# Patient Record
Sex: Male | Born: 1995 | Race: Black or African American | Hispanic: No | Marital: Single | Smoking: Never smoker
Health system: Southern US, Community
[De-identification: ages and names within clinical notes are randomized; demographics above are authoritative.]

## PROBLEM LIST (undated history)

## (undated) ENCOUNTER — Emergency Department (HOSPITAL_COMMUNITY): Admission: EM | Payer: Medicaid Other | Source: Home / Self Care

## (undated) DIAGNOSIS — T4145XA Adverse effect of unspecified anesthetic, initial encounter: Secondary | ICD-10-CM

## (undated) DIAGNOSIS — T8859XA Other complications of anesthesia, initial encounter: Secondary | ICD-10-CM

## (undated) DIAGNOSIS — G8929 Other chronic pain: Secondary | ICD-10-CM

## (undated) DIAGNOSIS — G4700 Insomnia, unspecified: Secondary | ICD-10-CM

## (undated) DIAGNOSIS — M542 Cervicalgia: Secondary | ICD-10-CM

## (undated) DIAGNOSIS — S14104A Unspecified injury at C4 level of cervical spinal cord, initial encounter: Secondary | ICD-10-CM

## (undated) DIAGNOSIS — W3400XA Accidental discharge from unspecified firearms or gun, initial encounter: Secondary | ICD-10-CM

## (undated) HISTORY — PX: COLON SURGERY: SHX602

---
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
## 2015-12-07 ENCOUNTER — Encounter (HOSPITAL_COMMUNITY): Admission: EM | Disposition: A | Discharge status: Home Health | Payer: Self-pay | Source: Home / Self Care

## 2015-12-07 ENCOUNTER — Inpatient Hospital Stay (HOSPITAL_COMMUNITY)
Admission: EM | Admit: 2015-12-07 | Discharge: 2015-12-24 | DRG: 329 | Disposition: A | Discharge status: Home Health | Payer: Medicaid Other | Attending: General Surgery | Admitting: General Surgery

## 2015-12-07 ENCOUNTER — Emergency Department (HOSPITAL_COMMUNITY): Payer: Medicaid Other

## 2015-12-07 ENCOUNTER — Emergency Department (HOSPITAL_COMMUNITY): Payer: Medicaid Other | Admitting: Anesthesiology

## 2015-12-07 ENCOUNTER — Encounter (HOSPITAL_COMMUNITY): Payer: Self-pay

## 2015-12-07 DIAGNOSIS — T8130XA Disruption of wound, unspecified, initial encounter: Secondary | ICD-10-CM | POA: Diagnosis not present

## 2015-12-07 DIAGNOSIS — D72829 Elevated white blood cell count, unspecified: Secondary | ICD-10-CM

## 2015-12-07 DIAGNOSIS — S36498A Other injury of other part of small intestine, initial encounter: Secondary | ICD-10-CM | POA: Diagnosis present

## 2015-12-07 DIAGNOSIS — T148 Other injury of unspecified body region: Secondary | ICD-10-CM | POA: Diagnosis present

## 2015-12-07 DIAGNOSIS — K651 Peritoneal abscess: Secondary | ICD-10-CM | POA: Diagnosis not present

## 2015-12-07 DIAGNOSIS — K659 Peritonitis, unspecified: Secondary | ICD-10-CM | POA: Diagnosis present

## 2015-12-07 DIAGNOSIS — R579 Shock, unspecified: Secondary | ICD-10-CM | POA: Diagnosis present

## 2015-12-07 DIAGNOSIS — N179 Acute kidney failure, unspecified: Secondary | ICD-10-CM | POA: Diagnosis present

## 2015-12-07 DIAGNOSIS — R630 Anorexia: Secondary | ICD-10-CM | POA: Diagnosis not present

## 2015-12-07 DIAGNOSIS — Y249XXA Unspecified firearm discharge, undetermined intent, initial encounter: Secondary | ICD-10-CM

## 2015-12-07 DIAGNOSIS — D62 Acute posthemorrhagic anemia: Secondary | ICD-10-CM | POA: Diagnosis not present

## 2015-12-07 DIAGNOSIS — Z590 Homelessness: Secondary | ICD-10-CM

## 2015-12-07 DIAGNOSIS — S36509A Unspecified injury of unspecified part of colon, initial encounter: Secondary | ICD-10-CM | POA: Diagnosis present

## 2015-12-07 DIAGNOSIS — W3400XA Accidental discharge from unspecified firearms or gun, initial encounter: Secondary | ICD-10-CM

## 2015-12-07 DIAGNOSIS — S31604A Unspecified open wound of abdominal wall, left lower quadrant with penetration into peritoneal cavity, initial encounter: Secondary | ICD-10-CM | POA: Diagnosis present

## 2015-12-07 DIAGNOSIS — S36409A Unspecified injury of unspecified part of small intestine, initial encounter: Secondary | ICD-10-CM | POA: Diagnosis present

## 2015-12-07 DIAGNOSIS — S31139A Puncture wound of abdominal wall without foreign body, unspecified quadrant without penetration into peritoneal cavity, initial encounter: Secondary | ICD-10-CM

## 2015-12-07 DIAGNOSIS — S36593A Other injury of sigmoid colon, initial encounter: Secondary | ICD-10-CM | POA: Diagnosis present

## 2015-12-07 DIAGNOSIS — K567 Ileus, unspecified: Secondary | ICD-10-CM

## 2015-12-07 DIAGNOSIS — S36503A Unspecified injury of sigmoid colon, initial encounter: Secondary | ICD-10-CM | POA: Diagnosis present

## 2015-12-07 DIAGNOSIS — T8149XA Infection following a procedure, other surgical site, initial encounter: Secondary | ICD-10-CM | POA: Diagnosis not present

## 2015-12-07 DIAGNOSIS — R188 Other ascites: Secondary | ICD-10-CM

## 2015-12-07 HISTORY — PX: COLOSTOMY REVISION: SHX5232

## 2015-12-07 HISTORY — PX: COLOSTOMY: SHX63

## 2015-12-07 HISTORY — PX: LAPAROTOMY: SHX154

## 2015-12-07 HISTORY — DX: Accidental discharge from unspecified firearms or gun, initial encounter: W34.00XA

## 2015-12-07 HISTORY — DX: Unspecified firearm discharge, undetermined intent, initial encounter: Y24.9XXA

## 2015-12-07 HISTORY — PX: BOWEL RESECTION: SHX1257

## 2015-12-07 LAB — TYPE AND SCREEN
ABO/RH(D): O POS
Antibody Screen: NEGATIVE
UNIT DIVISION: 0
UNIT DIVISION: 0

## 2015-12-07 LAB — COMPREHENSIVE METABOLIC PANEL
ALT: 27 U/L (ref 17–63)
AST: 24 U/L (ref 15–41)
Albumin: 3.6 g/dL (ref 3.5–5.0)
Alkaline Phosphatase: 67 U/L (ref 38–126)
Anion gap: 16 — ABNORMAL HIGH (ref 5–15)
BILIRUBIN TOTAL: 0.9 mg/dL (ref 0.3–1.2)
BUN: 10 mg/dL (ref 6–20)
CHLORIDE: 104 mmol/L (ref 101–111)
CO2: 18 mmol/L — ABNORMAL LOW (ref 22–32)
CREATININE: 1.33 mg/dL — AB (ref 0.61–1.24)
Calcium: 8.3 mg/dL — ABNORMAL LOW (ref 8.9–10.3)
Glucose, Bld: 180 mg/dL — ABNORMAL HIGH (ref 65–99)
Potassium: 3.1 mmol/L — ABNORMAL LOW (ref 3.5–5.1)
Sodium: 138 mmol/L (ref 135–145)
Total Protein: 6.6 g/dL (ref 6.5–8.1)

## 2015-12-07 LAB — CBC
HEMATOCRIT: 41.1 % (ref 39.0–52.0)
Hemoglobin: 13.5 g/dL (ref 13.0–17.0)
MCH: 29.5 pg (ref 26.0–34.0)
MCHC: 32.8 g/dL (ref 30.0–36.0)
MCV: 89.7 fL (ref 78.0–100.0)
Platelets: 269 10*3/uL (ref 150–400)
RBC: 4.58 MIL/uL (ref 4.22–5.81)
RDW: 12.7 % (ref 11.5–15.5)
WBC: 11 10*3/uL — ABNORMAL HIGH (ref 4.0–10.5)

## 2015-12-07 LAB — ABO/RH: ABO/RH(D): O POS

## 2015-12-07 LAB — PREPARE FRESH FROZEN PLASMA
UNIT DIVISION: 0
UNIT DIVISION: 0

## 2015-12-07 LAB — MRSA PCR SCREENING: MRSA by PCR: NEGATIVE

## 2015-12-07 LAB — PROTIME-INR
INR: 1.03 (ref 0.00–1.49)
Prothrombin Time: 13.7 seconds (ref 11.6–15.2)

## 2015-12-07 LAB — ETHANOL: Alcohol, Ethyl (B): 146 mg/dL — ABNORMAL HIGH (ref <5)

## 2015-12-07 LAB — CDS SEROLOGY

## 2015-12-07 SURGERY — LAPAROTOMY, EXPLORATORY
Anesthesia: General | Site: Abdomen

## 2015-12-07 MED ORDER — METOPROLOL TARTRATE 1 MG/ML IV SOLN: 5.0000 mg | Freq: Four times a day (QID) | INTRAVENOUS | Status: DC | PRN

## 2015-12-07 MED ORDER — HYDROMORPHONE 1 MG/ML IV SOLN
INTRAVENOUS | Status: DC
  Administered 2015-12-07: 0.9 mg via INTRAVENOUS
  Administered 2015-12-07: 11:00:00 via INTRAVENOUS
  Administered 2015-12-08 (×3): 1.2 mg via INTRAVENOUS
  Administered 2015-12-08: 0.9 mg via INTRAVENOUS
  Administered 2015-12-08: 1.8 mg via INTRAVENOUS
  Administered 2015-12-08: 0.6 mg via INTRAVENOUS
  Administered 2015-12-09 (×2): 3.3 mg via INTRAVENOUS
  Administered 2015-12-09: 2.7 mg via INTRAVENOUS
  Administered 2015-12-09: 1.2 mg via INTRAVENOUS
  Administered 2015-12-09: 1.8 mg via INTRAVENOUS
  Administered 2015-12-10: 0.9 mg via INTRAVENOUS
  Administered 2015-12-10: 1.8 mg via INTRAVENOUS
  Administered 2015-12-10: 1.5 mg via INTRAVENOUS
  Administered 2015-12-10: 0.6 mg via INTRAVENOUS
  Administered 2015-12-10 (×2): 1.2 mg via INTRAVENOUS
  Administered 2015-12-11: 1.9 mg via INTRAVENOUS
  Administered 2015-12-11: 3.6 mg via INTRAVENOUS
  Administered 2015-12-11: 4.2 mg via INTRAVENOUS
  Filled 2015-12-07 (×2): qty 25

## 2015-12-07 MED ORDER — PHENOL 1.4 % MT LIQD
2.0000 | OROMUCOSAL | Status: DC | PRN
  Administered 2015-12-09: 2 via OROMUCOSAL
  Filled 2015-12-07: qty 177

## 2015-12-07 MED ORDER — SUCCINYLCHOLINE CHLORIDE 20 MG/ML IJ SOLN
INTRAMUSCULAR | Status: DC | PRN
  Administered 2015-12-07: 120 mg via INTRAVENOUS

## 2015-12-07 MED ORDER — ALUM & MAG HYDROXIDE-SIMETH 200-200-20 MG/5ML PO SUSP: 30.0000 mL | Freq: Four times a day (QID) | ORAL | Status: DC | PRN

## 2015-12-07 MED ORDER — PROPOFOL 10 MG/ML IV BOLUS
INTRAVENOUS | Status: DC | PRN
  Administered 2015-12-07: 200 mg via INTRAVENOUS

## 2015-12-07 MED ORDER — ACETAMINOPHEN 325 MG PO TABS
325.0000 mg | ORAL_TABLET | Freq: Four times a day (QID) | ORAL | Status: DC | PRN
  Administered 2015-12-17 – 2015-12-18 (×3): 650 mg via ORAL
  Filled 2015-12-07 (×3): qty 2

## 2015-12-07 MED ORDER — MAGIC MOUTHWASH: 15.0000 mL | Freq: Four times a day (QID) | ORAL | Status: DC | PRN

## 2015-12-07 MED ORDER — ONDANSETRON HCL 4 MG/2ML IJ SOLN
INTRAMUSCULAR | Status: AC
  Filled 2015-12-07: qty 2

## 2015-12-07 MED ORDER — ALBUMIN HUMAN 5 % IV SOLN
INTRAVENOUS | Status: DC | PRN
  Administered 2015-12-07: 05:00:00 via INTRAVENOUS

## 2015-12-07 MED ORDER — FENTANYL CITRATE (PF) 250 MCG/5ML IJ SOLN
INTRAMUSCULAR | Status: DC | PRN
  Administered 2015-12-07: 100 ug via INTRAVENOUS

## 2015-12-07 MED ORDER — STERILE WATER FOR INJECTION IJ SOLN
INTRAMUSCULAR | Status: AC
  Filled 2015-12-07: qty 10

## 2015-12-07 MED ORDER — SODIUM CHLORIDE 0.9 % IV SOLN
8.0000 mg | Freq: Four times a day (QID) | INTRAVENOUS | Status: DC | PRN
  Filled 2015-12-07: qty 4

## 2015-12-07 MED ORDER — METHOCARBAMOL 1000 MG/10ML IJ SOLN
1000.0000 mg | Freq: Four times a day (QID) | INTRAMUSCULAR | Status: DC | PRN
  Administered 2015-12-12 (×2): 1000 mg via INTRAVENOUS
  Filled 2015-12-07 (×8): qty 10

## 2015-12-07 MED ORDER — ENOXAPARIN SODIUM 30 MG/0.3ML ~~LOC~~ SOLN
30.0000 mg | Freq: Two times a day (BID) | SUBCUTANEOUS | Status: AC
  Administered 2015-12-07 – 2015-12-15 (×16): 30 mg via SUBCUTANEOUS
  Filled 2015-12-07 (×19): qty 0.3

## 2015-12-07 MED ORDER — ETOMIDATE 2 MG/ML IV SOLN
INTRAVENOUS | Status: AC
  Filled 2015-12-07: qty 10

## 2015-12-07 MED ORDER — NEOSTIGMINE METHYLSULFATE 10 MG/10ML IV SOLN
INTRAVENOUS | Status: DC | PRN
  Administered 2015-12-07: 4 mg via INTRAVENOUS

## 2015-12-07 MED ORDER — ONDANSETRON HCL 4 MG/2ML IJ SOLN: 4.0000 mg | Freq: Four times a day (QID) | INTRAMUSCULAR | Status: DC | PRN

## 2015-12-07 MED ORDER — MENTHOL 3 MG MT LOZG
1.0000 | LOZENGE | OROMUCOSAL | Status: DC | PRN
  Filled 2015-12-07: qty 9

## 2015-12-07 MED ORDER — FENTANYL CITRATE (PF) 250 MCG/5ML IJ SOLN
INTRAMUSCULAR | Status: AC
  Filled 2015-12-07: qty 5

## 2015-12-07 MED ORDER — SODIUM CHLORIDE 0.9% FLUSH: 9.0000 mL | INTRAVENOUS | Status: DC | PRN

## 2015-12-07 MED ORDER — LIP MEDEX EX OINT
1.0000 "application " | TOPICAL_OINTMENT | Freq: Two times a day (BID) | CUTANEOUS | Status: DC
  Filled 2015-12-07: qty 7

## 2015-12-07 MED ORDER — PROMETHAZINE HCL 25 MG/ML IJ SOLN
6.2500 mg | INTRAMUSCULAR | Status: DC | PRN
  Administered 2015-12-10 – 2015-12-11 (×2): 12.5 mg via INTRAVENOUS
  Filled 2015-12-07 (×2): qty 1

## 2015-12-07 MED ORDER — LACTATED RINGERS IV SOLN
INTRAVENOUS | Status: DC | PRN
  Administered 2015-12-07 (×2): via INTRAVENOUS

## 2015-12-07 MED ORDER — SODIUM CHLORIDE 0.9 % IV BOLUS (SEPSIS)
2000.0000 mL | Freq: Once | INTRAVENOUS | Status: AC
  Administered 2015-12-07: 2000 mL via INTRAVENOUS

## 2015-12-07 MED ORDER — ONDANSETRON HCL 4 MG/2ML IJ SOLN
INTRAMUSCULAR | Status: DC | PRN
  Administered 2015-12-07: 4 mg via INTRAVENOUS

## 2015-12-07 MED ORDER — DIPHENHYDRAMINE HCL 50 MG/ML IJ SOLN: 12.5000 mg | Freq: Four times a day (QID) | INTRAMUSCULAR | Status: DC | PRN

## 2015-12-07 MED ORDER — LACTATED RINGERS IV SOLN
INTRAVENOUS | Status: DC
Start: 2015-12-07 — End: 2015-12-13
  Administered 2015-12-07 – 2015-12-11 (×6): via INTRAVENOUS

## 2015-12-07 MED ORDER — GLYCOPYRROLATE 0.2 MG/ML IJ SOLN
INTRAMUSCULAR | Status: AC
  Filled 2015-12-07: qty 3

## 2015-12-07 MED ORDER — DEXTROSE 5 % IV SOLN
2.0000 g | Freq: Once | INTRAVENOUS | Status: AC
  Administered 2015-12-07: 2 g via INTRAVENOUS
  Filled 2015-12-07: qty 2

## 2015-12-07 MED ORDER — DEXTROSE 5 % IV SOLN
2.0000 g | Freq: Two times a day (BID) | INTRAVENOUS | Status: AC
  Administered 2015-12-07: 2 g via INTRAVENOUS
  Filled 2015-12-07: qty 2

## 2015-12-07 MED ORDER — KETOROLAC TROMETHAMINE 15 MG/ML IJ SOLN
15.0000 mg | Freq: Four times a day (QID) | INTRAMUSCULAR | Status: DC | PRN
  Administered 2015-12-07 – 2015-12-09 (×2): 15 mg via INTRAVENOUS
  Filled 2015-12-07 (×2): qty 1

## 2015-12-07 MED ORDER — INFLUENZA VAC SPLIT QUAD 0.5 ML IM SUSY
0.5000 mL | PREFILLED_SYRINGE | INTRAMUSCULAR | Status: DC
  Filled 2015-12-07: qty 1
  Filled 2015-12-07 (×2): qty 0.5

## 2015-12-07 MED ORDER — PROMETHAZINE HCL 25 MG/ML IJ SOLN
INTRAMUSCULAR | Status: AC
  Filled 2015-12-07: qty 1

## 2015-12-07 MED ORDER — 0.9 % SODIUM CHLORIDE (POUR BTL) OPTIME
TOPICAL | Status: DC | PRN
  Administered 2015-12-07 (×6): 1000 mL

## 2015-12-07 MED ORDER — DIPHENHYDRAMINE HCL 50 MG/ML IJ SOLN
INTRAMUSCULAR | Status: DC | PRN
  Administered 2015-12-07: 25 mg via INTRAVENOUS

## 2015-12-07 MED ORDER — ROCURONIUM BROMIDE 100 MG/10ML IV SOLN
INTRAVENOUS | Status: DC | PRN
  Administered 2015-12-07: 50 mg via INTRAVENOUS

## 2015-12-07 MED ORDER — LIDOCAINE HCL (CARDIAC) 20 MG/ML IV SOLN
INTRAVENOUS | Status: AC
Start: 2015-12-07 — End: 2015-12-07
  Filled 2015-12-07: qty 10

## 2015-12-07 MED ORDER — LIDOCAINE HCL (CARDIAC) 20 MG/ML IV SOLN
INTRAVENOUS | Status: DC | PRN
  Administered 2015-12-07: 80 mg via INTRATRACHEAL

## 2015-12-07 MED ORDER — LACTATED RINGERS IV BOLUS (SEPSIS): 1000.0000 mL | Freq: Three times a day (TID) | INTRAVENOUS | Status: DC | PRN

## 2015-12-07 MED ORDER — EPHEDRINE SULFATE 50 MG/ML IJ SOLN
INTRAMUSCULAR | Status: AC
  Filled 2015-12-07: qty 1

## 2015-12-07 MED ORDER — HYDROMORPHONE HCL 1 MG/ML IJ SOLN: 0.5000 mg | INTRAMUSCULAR | Status: DC | PRN

## 2015-12-07 MED ORDER — GLYCOPYRROLATE 0.2 MG/ML IJ SOLN
INTRAMUSCULAR | Status: DC | PRN
  Administered 2015-12-07: 0.6 mg via INTRAVENOUS

## 2015-12-07 MED ORDER — SUCCINYLCHOLINE CHLORIDE 20 MG/ML IJ SOLN
INTRAMUSCULAR | Status: AC
  Filled 2015-12-07: qty 1

## 2015-12-07 MED ORDER — NALOXONE HCL 0.4 MG/ML IJ SOLN: 0.4000 mg | INTRAMUSCULAR | Status: DC | PRN

## 2015-12-07 MED ORDER — DIPHENHYDRAMINE HCL 12.5 MG/5ML PO ELIX: 12.5000 mg | ORAL_SOLUTION | Freq: Four times a day (QID) | ORAL | Status: DC | PRN

## 2015-12-07 MED ORDER — PHENYLEPHRINE HCL 10 MG/ML IJ SOLN
INTRAMUSCULAR | Status: AC
  Filled 2015-12-07: qty 1

## 2015-12-07 MED ORDER — PHENYLEPHRINE 40 MCG/ML (10ML) SYRINGE FOR IV PUSH (FOR BLOOD PRESSURE SUPPORT)
PREFILLED_SYRINGE | INTRAVENOUS | Status: AC
  Filled 2015-12-07: qty 10

## 2015-12-07 MED ORDER — NEOSTIGMINE METHYLSULFATE 10 MG/10ML IV SOLN
INTRAVENOUS | Status: AC
  Filled 2015-12-07: qty 2

## 2015-12-07 MED ORDER — LORAZEPAM 2 MG/ML IJ SOLN
0.5000 mg | Freq: Three times a day (TID) | INTRAMUSCULAR | Status: DC | PRN
  Administered 2015-12-11 – 2015-12-14 (×3): 1 mg via INTRAVENOUS
  Filled 2015-12-07 (×3): qty 1

## 2015-12-07 MED ORDER — ONDANSETRON HCL 4 MG/2ML IJ SOLN
4.0000 mg | Freq: Four times a day (QID) | INTRAMUSCULAR | Status: DC | PRN
  Administered 2015-12-10 – 2015-12-24 (×3): 4 mg via INTRAVENOUS
  Filled 2015-12-07 (×3): qty 2

## 2015-12-07 MED ORDER — PROMETHAZINE HCL 25 MG/ML IJ SOLN
6.2500 mg | INTRAMUSCULAR | Status: DC | PRN
  Administered 2015-12-07: 6.25 mg via INTRAVENOUS

## 2015-12-07 MED ORDER — HYDROMORPHONE HCL 1 MG/ML IJ SOLN
INTRAMUSCULAR | Status: AC
  Filled 2015-12-07: qty 1

## 2015-12-07 MED ORDER — MIDAZOLAM HCL 2 MG/2ML IJ SOLN
INTRAMUSCULAR | Status: AC
  Filled 2015-12-07: qty 2

## 2015-12-07 MED ORDER — ROCURONIUM BROMIDE 50 MG/5ML IV SOLN
INTRAVENOUS | Status: AC
  Filled 2015-12-07: qty 2

## 2015-12-07 MED ORDER — HYDROMORPHONE HCL 1 MG/ML IJ SOLN
0.2500 mg | INTRAMUSCULAR | Status: DC | PRN
  Administered 2015-12-07: 0.5 mg via INTRAVENOUS

## 2015-12-07 MED ORDER — ARTIFICIAL TEARS OP OINT
TOPICAL_OINTMENT | OPHTHALMIC | Status: AC
  Filled 2015-12-07: qty 3.5

## 2015-12-07 MED ORDER — BLISTEX MEDICATED EX OINT
TOPICAL_OINTMENT | Freq: Two times a day (BID) | CUTANEOUS | Status: DC
  Administered 2015-12-07 – 2015-12-16 (×18): via TOPICAL
  Administered 2015-12-16: 1 via TOPICAL
  Administered 2015-12-18 – 2015-12-21 (×6): via TOPICAL
  Administered 2015-12-22: 1 via TOPICAL
  Administered 2015-12-23 – 2015-12-24 (×3): via TOPICAL
  Filled 2015-12-07: qty 10

## 2015-12-07 SURGICAL SUPPLY — 51 items
BLADE SURG ROTATE 9660 (MISCELLANEOUS) IMPLANT
CANISTER SUCTION 2500CC (MISCELLANEOUS) ×4 IMPLANT
CHLORAPREP W/TINT 26ML (MISCELLANEOUS) ×4 IMPLANT
COVER SURGICAL LIGHT HANDLE (MISCELLANEOUS) ×4 IMPLANT
DRAPE LAPAROSCOPIC ABDOMINAL (DRAPES) ×4 IMPLANT
DRAPE WARM FLUID 44X44 (DRAPE) ×4 IMPLANT
DRSG OPSITE POSTOP 4X10 (GAUZE/BANDAGES/DRESSINGS) ×2 IMPLANT
DRSG OPSITE POSTOP 4X8 (GAUZE/BANDAGES/DRESSINGS) IMPLANT
ELECT BLADE 6.5 EXT (BLADE) IMPLANT
ELECT CAUTERY BLADE 6.4 (BLADE) ×4 IMPLANT
ELECT REM PT RETURN 9FT ADLT (ELECTROSURGICAL) ×4
ELECTRODE REM PT RTRN 9FT ADLT (ELECTROSURGICAL) ×2 IMPLANT
GAUZE SPONGE 2X2 8PLY STRL LF (GAUZE/BANDAGES/DRESSINGS) IMPLANT
GLOVE BIOGEL PI IND STRL 8 (GLOVE) ×2 IMPLANT
GLOVE BIOGEL PI INDICATOR 8 (GLOVE) ×2
GLOVE ECLIPSE 8.0 STRL XLNG CF (GLOVE) ×6 IMPLANT
GLOVE SURG SS PI 7.0 STRL IVOR (GLOVE) ×2 IMPLANT
GOWN STRL REUS W/ TWL LRG LVL3 (GOWN DISPOSABLE) ×2 IMPLANT
GOWN STRL REUS W/ TWL XL LVL3 (GOWN DISPOSABLE) ×2 IMPLANT
GOWN STRL REUS W/TWL LRG LVL3 (GOWN DISPOSABLE) ×4
GOWN STRL REUS W/TWL XL LVL3 (GOWN DISPOSABLE) ×4
KIT BASIN OR (CUSTOM PROCEDURE TRAY) ×4 IMPLANT
KIT OSTOMY DRAINABLE 2.75 STR (WOUND CARE) ×2 IMPLANT
KIT ROOM TURNOVER OR (KITS) ×4 IMPLANT
LIGASURE IMPACT 36 18CM CVD LR (INSTRUMENTS) ×2 IMPLANT
NS IRRIG 1000ML POUR BTL (IV SOLUTION) ×8 IMPLANT
PACK GENERAL/GYN (CUSTOM PROCEDURE TRAY) ×4 IMPLANT
PAD ARMBOARD 7.5X6 YLW CONV (MISCELLANEOUS) ×4 IMPLANT
RELOAD AUTO 90-3.5 TA90 BLE (ENDOMECHANICALS) ×4 IMPLANT
RELOAD PROXIMATE 75MM BLUE (ENDOMECHANICALS) ×12 IMPLANT
RELOAD STAPLE 75 3.8 BLU REG (ENDOMECHANICALS) IMPLANT
RELOAD STAPLE 90 BLU REG (ENDOMECHANICALS) IMPLANT
SCRUB PCMX 4 OZ (MISCELLANEOUS) ×2 IMPLANT
SPECIMEN JAR LARGE (MISCELLANEOUS) IMPLANT
SPONGE GAUZE 2X2 STER 10/PKG (GAUZE/BANDAGES/DRESSINGS) ×2
SPONGE LAP 18X18 X RAY DECT (DISPOSABLE) ×14 IMPLANT
STAPLER 90 3.5 STAND SLIM (STAPLE) ×4
STAPLER 90 3.5 STD SLIM (STAPLE) IMPLANT
STAPLER PROXIMATE 75MM BLUE (STAPLE) ×2 IMPLANT
STAPLER VISISTAT 35W (STAPLE) ×4 IMPLANT
SUCTION POOLE TIP (SUCTIONS) ×4 IMPLANT
SUT PDS AB 1 TP1 96 (SUTURE) ×8 IMPLANT
SUT SILK 2 0 SH CR/8 (SUTURE) ×6 IMPLANT
SUT SILK 2 0 TIES 10X30 (SUTURE) ×4 IMPLANT
SUT SILK 3 0 SH CR/8 (SUTURE) ×4 IMPLANT
SUT SILK 3 0 TIES 10X30 (SUTURE) ×4 IMPLANT
SUT VIC AB 2-0 SH 18 (SUTURE) ×6 IMPLANT
TAPE UMBILICAL COTTON 1/8X30 (MISCELLANEOUS) ×2 IMPLANT
TOWEL OR 17X26 10 PK STRL BLUE (TOWEL DISPOSABLE) ×4 IMPLANT
TRAY FOLEY CATH 16FRSI W/METER (SET/KITS/TRAYS/PACK) ×4 IMPLANT
YANKAUER SUCT BULB TIP NO VENT (SUCTIONS) IMPLANT
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
## 2015-12-07 NOTE — Progress Notes (Signed)
HR elevated during ambulation

## 2015-12-07 NOTE — Progress Notes (Signed)
Utilization Review Completed.Andy Moye T3/02/2016  
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
## 2015-12-07 NOTE — Anesthesia Procedure Notes (Signed)
Procedure Name: Intubation Date/Time: 12/07/2015 4:00 AM Performed by: Brien MatesMAHONY, Marrianne Sica D Pre-anesthesia Checklist: Patient identified, Emergency Drugs available, Suction available, Patient being monitored and Timeout performed Patient Re-evaluated:Patient Re-evaluated prior to inductionOxygen Delivery Method: Circle system utilized Preoxygenation: Pre-oxygenation with 100% oxygen Intubation Type: IV induction, Rapid sequence and Cricoid Pressure applied Laryngoscope Size: Miller and 2 Grade View: Grade I Tube type: Oral Tube size: 7.5 mm Number of attempts: 1 Airway Equipment and Method: Stylet Placement Confirmation: ETT inserted through vocal cords under direct vision,  positive ETCO2 and breath sounds checked- equal and bilateral Secured at: 22 cm Tube secured with: Tape Dental Injury: Teeth and Oropharynx as per pre-operative assessment

## 2015-12-07 NOTE — Anesthesia Postprocedure Evaluation (Signed)
Anesthesia Post Note  Patient: James [name]  Procedure(s) Performed: Procedure(s) (LRB): EXPLORATORY LAPAROTOMY (N/A) SMALL BOWEL RESECTION (N/A) COLON RESECTION SIGMOID (N/A) COLOSTOMY (Left)  Patient location during evaluation: PACU Anesthesia Type: General Level of consciousness: awake and alert Pain management: pain level controlled Vital Signs Assessment: post-procedure vital signs reviewed and stable Respiratory status: spontaneous breathing, nonlabored ventilation, respiratory function stable and patient connected to nasal cannula oxygen Cardiovascular status: blood pressure returned to baseline and stable Postop Assessment: no signs of nausea or vomiting Anesthetic complications: no    Last Vitals:  Filed Vitals:   12/07/15 0630 12/07/15 0635  BP: 130/73 127/72  Pulse: 93 100  Temp: 37 C   Resp: 26 31    Last Pain:  Filed Vitals:   12/07/15 0640  PainSc: 3                  James [name], James [name]
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
## 2015-12-07 NOTE — Transfer of Care (Signed)
Immediate Anesthesia Transfer of Care Note  Patient: James [name]  Procedure(s) Performed: Procedure(s): EXPLORATORY LAPAROTOMY (N/A) SMALL BOWEL RESECTION (N/A) COLON RESECTION SIGMOID (N/A) COLOSTOMY (Left)  Patient Location: PACU  Anesthesia Type:General  Level of Consciousness: sedated  Airway & Oxygen Therapy: Patient Spontanous Breathing and Patient connected to face mask oxygen  Post-op Assessment: Report given to RN and Post -op Vital signs reviewed and stable  Post vital signs: Reviewed and stable  Last Vitals:  Filed Vitals:   12/07/15 0324 12/07/15 0330  BP: 117/74 119/73  Pulse: 72 81  Temp:    Resp: 33 33    Complications: No apparent anesthesia complications
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
## 2015-12-07 NOTE — ED Notes (Signed)
OR ready now. Pt being taken up to OR now

## 2015-12-07 NOTE — Plan of Care (Signed)
Problem: Nutrition: Goal: Adequate nutrition will be maintained Outcome: Not Progressing Pt is currently NPO per order  Problem: Activity: Goal: Ability to tolerate increased activity will improve Outcome: Progressing Pt has ambulated twice today  Problem: Bowel/Gastric: Goal: Gastrointestinal status for postoperative course will improve Outcome: Progressing Pt has minimal output from NG tube, no nausea  Problem: Physical Regulation: Goal: Postoperative complications will be avoided or minimized Outcome: Progressing Pt willing to C, DB, use IS, and move about

## 2015-12-07 NOTE — Progress Notes (Signed)
Started PCA per order verified with 2 RN's. Patient education completed

## 2015-12-08 ENCOUNTER — Encounter (HOSPITAL_COMMUNITY): Payer: Self-pay | Admitting: Surgery

## 2015-12-08 LAB — CBC
HEMATOCRIT: 30.3 % — AB (ref 39.0–52.0)
HEMOGLOBIN: 10.1 g/dL — AB (ref 13.0–17.0)
MCH: 30.1 pg (ref 26.0–34.0)
MCHC: 33.3 g/dL (ref 30.0–36.0)
MCV: 90.2 fL (ref 78.0–100.0)
Platelets: 169 10*3/uL (ref 150–400)
RBC: 3.36 MIL/uL — AB (ref 4.22–5.81)
RDW: 12.9 % (ref 11.5–15.5)
WBC: 13.4 10*3/uL — AB (ref 4.0–10.5)

## 2015-12-08 LAB — BASIC METABOLIC PANEL
Anion gap: 7 (ref 5–15)
BUN: 9 mg/dL (ref 6–20)
CHLORIDE: 105 mmol/L (ref 101–111)
CO2: 26 mmol/L (ref 22–32)
Calcium: 7.5 mg/dL — ABNORMAL LOW (ref 8.9–10.3)
Creatinine, Ser: 1.05 mg/dL (ref 0.61–1.24)
GFR calc non Af Amer: >60 mL/min (ref >60)
Glucose, Bld: 117 mg/dL — ABNORMAL HIGH (ref 65–99)
POTASSIUM: 4 mmol/L (ref 3.5–5.1)
SODIUM: 138 mmol/L (ref 135–145)

## 2015-12-08 LAB — BLOOD PRODUCT ORDER (VERBAL) VERIFICATION
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
## 2015-12-08 NOTE — Progress Notes (Signed)
Report given to Liborio NixonJanice, CaliforniaRN. Louie BunWilson,Wasim Hurlbut S 1:01 PM

## 2015-12-09 ENCOUNTER — Encounter (HOSPITAL_COMMUNITY): Payer: Self-pay

## 2015-12-09 DIAGNOSIS — D62 Acute posthemorrhagic anemia: Secondary | ICD-10-CM | POA: Diagnosis not present

## 2015-12-09 DIAGNOSIS — S36509A Unspecified injury of unspecified part of colon, initial encounter: Secondary | ICD-10-CM | POA: Diagnosis present

## 2015-12-09 MED ORDER — PANTOPRAZOLE SODIUM 40 MG IV SOLR
40.0000 mg | INTRAVENOUS | Status: DC
  Administered 2015-12-09 – 2015-12-13 (×5): 40 mg via INTRAVENOUS
  Filled 2015-12-09 (×5): qty 40
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
## 2015-12-10 LAB — CBC
HCT: 26.6 % — ABNORMAL LOW (ref 39.0–52.0)
Hemoglobin: 9.2 g/dL — ABNORMAL LOW (ref 13.0–17.0)
MCH: 30.9 pg (ref 26.0–34.0)
MCHC: 34.6 g/dL (ref 30.0–36.0)
MCV: 89.3 fL (ref 78.0–100.0)
PLATELETS: 203 10*3/uL (ref 150–400)
RBC: 2.98 MIL/uL — AB (ref 4.22–5.81)
RDW: 12.5 % (ref 11.5–15.5)
WBC: 9.7 10*3/uL (ref 4.0–10.5)

## 2015-12-10 NOTE — Consult Note (Addendum)
WOC ostomy follow up Stoma type/location: Taught patient first ostomy bag change Stomal assessment/size: size 1 3/4, stoma pink and swollen with bloody mucous, stoma above skin level Peristomal assessment: Skin intact Output 100cc bloody drainage, no flatus Ostomy pouching: 1pc..  Education provided: Demonstrated how to change bag, cut wafer but patient was not very engaged. Closed his eyes throughout the process and refused to look at stoma when asked. Will continue pouch teaching and management education on Friday  Enrolled patient in [place] [employer] Discharge program: Yes Thanks, Lemmie EvensJacqueline Clarke MSN, RN, [employer]OCN
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
## 2015-12-11 LAB — BASIC METABOLIC PANEL
ANION GAP: 9 (ref 5–15)
BUN: 12 mg/dL (ref 6–20)
CALCIUM: 8.2 mg/dL — AB (ref 8.9–10.3)
CO2: 27 mmol/L (ref 22–32)
CREATININE: 0.79 mg/dL (ref 0.61–1.24)
Chloride: 102 mmol/L (ref 101–111)
Glucose, Bld: 115 mg/dL — ABNORMAL HIGH (ref 65–99)
Potassium: 4.1 mmol/L (ref 3.5–5.1)
SODIUM: 138 mmol/L (ref 135–145)

## 2015-12-11 LAB — CBC
HEMATOCRIT: 27.3 % — AB (ref 39.0–52.0)
HEMOGLOBIN: 9.3 g/dL — AB (ref 13.0–17.0)
MCH: 30.4 pg (ref 26.0–34.0)
MCHC: 34.1 g/dL (ref 30.0–36.0)
MCV: 89.2 fL (ref 78.0–100.0)
PLATELETS: 261 10*3/uL (ref 150–400)
RBC: 3.06 MIL/uL — AB (ref 4.22–5.81)
RDW: 12.8 % (ref 11.5–15.5)
WBC: 9.9 10*3/uL (ref 4.0–10.5)

## 2015-12-11 MED ORDER — HYDROMORPHONE HCL 1 MG/ML IJ SOLN
0.5000 mg | INTRAMUSCULAR | Status: DC | PRN
  Administered 2015-12-11 – 2015-12-12 (×7): 1 mg via INTRAVENOUS
  Filled 2015-12-11 (×8): qty 1

## 2015-12-11 MED ORDER — KETOROLAC TROMETHAMINE 30 MG/ML IJ SOLN
30.0000 mg | Freq: Three times a day (TID) | INTRAMUSCULAR | Status: DC
  Administered 2015-12-11: 30 mg via INTRAVENOUS
  Filled 2015-12-11: qty 1
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
## 2015-12-12 MED ORDER — OXYCODONE HCL 5 MG PO TABS
5.0000 mg | ORAL_TABLET | ORAL | Status: DC | PRN
  Administered 2015-12-12 (×2): 15 mg via ORAL
  Administered 2015-12-12: 10 mg via ORAL
  Administered 2015-12-14 – 2015-12-15 (×4): 15 mg via ORAL
  Administered 2015-12-15: 10 mg via ORAL
  Administered 2015-12-15 – 2015-12-24 (×29): 15 mg via ORAL
  Filled 2015-12-12 (×10): qty 3
  Filled 2015-12-12: qty 2
  Filled 2015-12-12 (×30): qty 3

## 2015-12-12 MED ORDER — HYDROMORPHONE HCL 1 MG/ML IJ SOLN
0.5000 mg | INTRAMUSCULAR | Status: DC | PRN
  Administered 2015-12-12: 0.5 mg via INTRAVENOUS
  Filled 2015-12-12 (×3): qty 1

## 2015-12-12 MED ORDER — NAPROXEN 250 MG PO TABS
500.0000 mg | ORAL_TABLET | Freq: Two times a day (BID) | ORAL | Status: DC
  Administered 2015-12-12 – 2015-12-24 (×24): 500 mg via ORAL
  Filled 2015-12-12 (×25): qty 2
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
## 2015-12-12 NOTE — Consult Note (Addendum)
WOC ostomy follow up Stoma type/location: Colostomy intact to LLQ. Stomal assessment/size: 1 3/4 inches, stoma pink and swollen; stoma above skin level Peristomal assessment: Skin intact Output:Scant amt pink tinged drainage, no stool or flatus Ostomy pouching: 2 piece  Education provided: Demonstrated pouch change and discussed pouching routines and ordering supplies. Pt and mother were reluctant to assist with pouch change or look at stoma but eventually participated.  Pt was able to open and close to empty but becomes nauseated and does not want to touch the area near the stoma and mom is crying and upset related to pouch change.    Enrolled patient in [place] [employer] Discharge program: Yes Cammie Mcgeeawn Aliveah Gallant MSN, RN, [place], [place]-AP, [place] [phone]

## 2015-12-12 NOTE — Progress Notes (Signed)
Noted pt and mother's reaction to [facility] teaching session.  Pt will need HHRN at discharge to assist with continuation of colostomy teaching upon dc.  Will follow up with MD/PA for orders.    Quintella BatonJulie W. Shyler Hamill, RN, BSN  Trauma/Neuro ICU Case Manager [phone]
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
## 2015-12-13 DIAGNOSIS — T8149XA Infection following a procedure, other surgical site, initial encounter: Secondary | ICD-10-CM | POA: Diagnosis not present

## 2015-12-13 MED ORDER — VANCOMYCIN HCL 10 G IV SOLR
1250.0000 mg | Freq: Once | INTRAVENOUS | Status: AC
  Administered 2015-12-13: 1250 mg via INTRAVENOUS
  Filled 2015-12-13: qty 1250

## 2015-12-13 MED ORDER — METHOCARBAMOL 500 MG PO TABS
1000.0000 mg | ORAL_TABLET | Freq: Four times a day (QID) | ORAL | Status: DC | PRN
  Administered 2015-12-16 – 2015-12-23 (×8): 1000 mg via ORAL
  Filled 2015-12-13 (×9): qty 2

## 2015-12-13 MED ORDER — VANCOMYCIN HCL IN DEXTROSE 750-5 MG/150ML-% IV SOLN
750.0000 mg | Freq: Three times a day (TID) | INTRAVENOUS | Status: DC
  Administered 2015-12-13 – 2015-12-14 (×3): 750 mg via INTRAVENOUS
  Filled 2015-12-13 (×5): qty 150

## 2015-12-13 MED ORDER — PIPERACILLIN-TAZOBACTAM 3.375 G IVPB
3.3750 g | Freq: Three times a day (TID) | INTRAVENOUS | Status: DC
  Administered 2015-12-13 – 2015-12-24 (×33): 3.375 g via INTRAVENOUS
  Filled 2015-12-13 (×35): qty 50
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
## 2015-12-14 LAB — CBC
HEMATOCRIT: 29.3 % — AB (ref 39.0–52.0)
HEMOGLOBIN: 9.7 g/dL — AB (ref 13.0–17.0)
MCH: 28.9 pg (ref 26.0–34.0)
MCHC: 33.1 g/dL (ref 30.0–36.0)
MCV: 87.2 fL (ref 78.0–100.0)
Platelets: 349 10*3/uL (ref 150–400)
RBC: 3.36 MIL/uL — AB (ref 4.22–5.81)
RDW: 12.6 % (ref 11.5–15.5)
WBC: 11.1 10*3/uL — AB (ref 4.0–10.5)

## 2015-12-14 LAB — VANCOMYCIN, TROUGH: Vancomycin Tr: 7 ug/mL — ABNORMAL LOW (ref 10.0–20.0)

## 2015-12-14 MED ORDER — VANCOMYCIN HCL IN DEXTROSE 1-5 GM/200ML-% IV SOLN
1000.0000 mg | Freq: Three times a day (TID) | INTRAVENOUS | Status: DC
  Administered 2015-12-14 – 2015-12-17 (×8): 1000 mg via INTRAVENOUS
  Filled 2015-12-14 (×10): qty 200
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
## 2015-12-15 LAB — CBC
HCT: 28.8 % — ABNORMAL LOW (ref 39.0–52.0)
Hemoglobin: 9.4 g/dL — ABNORMAL LOW (ref 13.0–17.0)
MCH: 28.5 pg (ref 26.0–34.0)
MCHC: 32.6 g/dL (ref 30.0–36.0)
MCV: 87.3 fL (ref 78.0–100.0)
PLATELETS: 389 10*3/uL (ref 150–400)
RBC: 3.3 MIL/uL — ABNORMAL LOW (ref 4.22–5.81)
RDW: 12.7 % (ref 11.5–15.5)
WBC: 8.5 10*3/uL (ref 4.0–10.5)

## 2015-12-15 NOTE — Clinical Social Work Note (Signed)
Clinical Social Worker continuing to follow patient and family for support and discharge planning needs.  CSW spoke with patient at bedside to provide update - patient and mother remain homeless at this time per patient report.  CSW working on a potential option for patient and patient mother, however no guarantee and patient aware.  CSW remains available for support and to facilitate patient discharge needs if appropriate.  Macario GoldsJesse Joell Usman, [place] [phone]
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
## 2015-12-15 NOTE — Consult Note (Addendum)
WOC ostomy follow up Surgical team following for assessment and plan of care to abd wound. Stoma type/location:  Colostomy pouch in place with good seal.  Pt declines offer to demonstrate pouch change again.  He states, "I will be ok doing it when I am alone."  He feels that he learned everything during the previous demonstrations of a pouch change, and verbalized that he has emptied the pouch and closed the velcro without any assistance.  Output: No stool or flatus, small amt pink drainage in the pouch.  Ostomy pouching: 2pc.  Enrolled patient in [facility] Discharge program: Yes; I had previously enrolled him in the program and a box was shipped to an address that the patient will not be returning to now upon discharge. Pt is not sure where he will be staying after discharge.  1 box of free sample pouches given (10) and encouraged patient to call the [facility] indigent program for ostomy supplies after discharge and phone number was provided for this program.  He verbalized understanding. Reviewed pouching routines and obtaining supplies, he denies further questions and again stated he does not want another teaching session performed. Please re-consult if further assistance is needed.  Thank-you,  Cammie Mcgeeawn Damian Buckles MSN, RN, CWOCN, [place], CNS [phone]

## 2015-12-16 ENCOUNTER — Inpatient Hospital Stay (HOSPITAL_COMMUNITY): Payer: Medicaid Other

## 2015-12-16 LAB — WOUND CULTURE

## 2015-12-16 LAB — VANCOMYCIN, TROUGH: VANCOMYCIN TR: 16 ug/mL (ref 10.0–20.0)

## 2015-12-16 MED ORDER — POLYETHYLENE GLYCOL 3350 17 G PO PACK
17.0000 g | PACK | Freq: Every day | ORAL | Status: DC
  Administered 2015-12-16 – 2015-12-19 (×3): 17 g via ORAL
  Filled 2015-12-16 (×8): qty 1

## 2015-12-16 MED ORDER — DOCUSATE SODIUM 100 MG PO CAPS
100.0000 mg | ORAL_CAPSULE | Freq: Two times a day (BID) | ORAL | Status: DC
  Administered 2015-12-16 – 2015-12-24 (×16): 100 mg via ORAL
  Filled 2015-12-16 (×16): qty 1

## 2015-12-16 MED ORDER — ENSURE ENLIVE PO LIQD
237.0000 mL | Freq: Three times a day (TID) | ORAL | Status: DC
  Administered 2015-12-16: 237 mL via ORAL

## 2015-12-16 MED ORDER — BOOST / RESOURCE BREEZE PO LIQD
1.0000 | Freq: Three times a day (TID) | ORAL | Status: DC
  Administered 2015-12-18: 1 via ORAL

## 2015-12-16 MED ORDER — ADULT MULTIVITAMIN W/MINERALS CH
1.0000 | ORAL_TABLET | Freq: Every day | ORAL | Status: DC
  Administered 2015-12-17 – 2015-12-24 (×7): 1 via ORAL
  Filled 2015-12-16 (×7): qty 1

## 2015-12-16 NOTE — Clinical Social Work Note (Signed)
Clinical Social Worker continuing to follow patient and family for support and discharge planning needs.  CSW spoke with patient and patient mother at bedside regarding potential discharge options.  Patient mother now states that she works night shift and stated to the RN, "I have to run home to grab him pajamas."  CSW inquired about patient and patient mother housing situation and there is still the concern from them that they will be homeless at discharge.  CSW briefly discussed the potential for SNF, however patient is not appropriate.  CSW has discussed with supervisor and feels that this is not in the best interest of the patient.  Patient and patient mother notified that patient is close to discharge and need to have alternative arrangements made.  CSW to notify RN.  Patient is [place] at discharge.  CSW encouraged patient to work on those plans today.  CSW remains available for support and will follow up regarding discharge planning.  James [name], [place] [phone]
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
## 2015-12-16 NOTE — Consult Note (Addendum)
WOC ostomy follow up Surgical team following for assessment and plan of care to abd wound. Stoma type/location: Colostomy intact to LLQ. Stomal assessment/size: 1 3/4 inches, stoma pink and swollen; stoma above skin level Peristomal assessment: Skin intact Output: Mod amt pink tinged drainage, no stool or flatus Ostomy pouching: 2 piece  Education provided: Demonstrated pouch change and discussed pouching routines and ordering supplies. Pt was able to assist with pouch change and was able to open and close to empty. Reviewed pouching routines and ordering supplies. They deny further questions at this time. Enrolled patient in [facility] Dis[facility] program: Yes Cammie Mcgeeawn Jaydin Jalomo MSN, RN, [place], [place]AP, [place] [phone]

## 2015-12-17 ENCOUNTER — Inpatient Hospital Stay (HOSPITAL_COMMUNITY): Payer: Medicaid Other

## 2015-12-17 LAB — CBC
HCT: 28.4 % — ABNORMAL LOW (ref 39.0–52.0)
Hemoglobin: 9.3 g/dL — ABNORMAL LOW (ref 13.0–17.0)
MCH: 28.7 pg (ref 26.0–34.0)
MCHC: 32.7 g/dL (ref 30.0–36.0)
MCV: 87.7 fL (ref 78.0–100.0)
PLATELETS: 453 10*3/uL — AB (ref 150–400)
RBC: 3.24 MIL/uL — AB (ref 4.22–5.81)
RDW: 12.8 % (ref 11.5–15.5)
WBC: 13.5 10*3/uL — AB (ref 4.0–10.5)

## 2015-12-17 LAB — BASIC METABOLIC PANEL
ANION GAP: 12 (ref 5–15)
BUN: 8 mg/dL (ref 6–20)
CHLORIDE: 100 mmol/L — AB (ref 101–111)
CO2: 27 mmol/L (ref 22–32)
CREATININE: 1.69 mg/dL — AB (ref 0.61–1.24)
Calcium: 8.6 mg/dL — ABNORMAL LOW (ref 8.9–10.3)
GFR calc non Af Amer: 57 mL/min — ABNORMAL LOW (ref >60)
Glucose, Bld: 101 mg/dL — ABNORMAL HIGH (ref 65–99)
POTASSIUM: 3.8 mmol/L (ref 3.5–5.1)
Sodium: 139 mmol/L (ref 135–145)

## 2015-12-17 MED ORDER — POTASSIUM CHLORIDE IN NACL 20-0.9 MEQ/L-% IV SOLN
INTRAVENOUS | Status: DC
  Administered 2015-12-17 – 2015-12-23 (×11): via INTRAVENOUS
  Filled 2015-12-17 (×16): qty 1000

## 2015-12-17 MED ORDER — IOHEXOL 300 MG/ML  SOLN
25.0000 mL | INTRAMUSCULAR | Status: AC
  Administered 2015-12-17 (×2): 25 mL via ORAL

## 2015-12-17 NOTE — Progress Notes (Signed)
CT scan reviewed, he has a pelvic fluid collections which is walled off.  Will see if IR can attempt drainage tomorrow and obtain cultures.  Reg diet for now.  Make NPO and hold lovenox after MN for possible procedure tomorrow.   Jorje GuildMegan Amily Depp, PA-C General Surgery [facility] [phone]

## 2015-12-17 NOTE — Progress Notes (Signed)
PT Cancellation Note  Patient Details Name: James [name] MRN: [id number] DOB: [birth year]   Cancelled Treatment:    Reason Eval/Treat Not Completed: PT screened, no needs identified, will sign off.  Pt is active with our rehab mobility team and is doing well, walking independently, 2 laps around the unit (>1,000').  Acute PT is not needed at this time.  Spoke with RN who agrees.  Pt will continue to be seen by mobility team for walking.  Thanks,    Rollene Rotundaebecca B. Runette Scifres, PT, DPT [phone]   12/17/2015, 12:25 PM
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
## 2015-12-18 ENCOUNTER — Inpatient Hospital Stay (HOSPITAL_COMMUNITY): Payer: Medicaid Other

## 2015-12-18 LAB — BASIC METABOLIC PANEL
ANION GAP: 8 (ref 5–15)
BUN: 9 mg/dL (ref 6–20)
CO2: 25 mmol/L (ref 22–32)
Calcium: 8.4 mg/dL — ABNORMAL LOW (ref 8.9–10.3)
Chloride: 105 mmol/L (ref 101–111)
Creatinine, Ser: 1.81 mg/dL — ABNORMAL HIGH (ref 0.61–1.24)
GFR calc Af Amer: >60 mL/min (ref >60)
GFR calc non Af Amer: 53 mL/min — ABNORMAL LOW (ref >60)
GLUCOSE: 92 mg/dL (ref 65–99)
POTASSIUM: 4.2 mmol/L (ref 3.5–5.1)
Sodium: 138 mmol/L (ref 135–145)

## 2015-12-18 LAB — CBC
HEMATOCRIT: 30.2 % — AB (ref 39.0–52.0)
Hemoglobin: 10 g/dL — ABNORMAL LOW (ref 13.0–17.0)
MCH: 29.3 pg (ref 26.0–34.0)
MCHC: 33.1 g/dL (ref 30.0–36.0)
MCV: 88.6 fL (ref 78.0–100.0)
Platelets: 490 10*3/uL — ABNORMAL HIGH (ref 150–400)
RBC: 3.41 MIL/uL — ABNORMAL LOW (ref 4.22–5.81)
RDW: 13 % (ref 11.5–15.5)
WBC: 15.8 10*3/uL — AB (ref 4.0–10.5)

## 2015-12-18 MED ORDER — LIDOCAINE HCL 1 % IJ SOLN
INTRAMUSCULAR | Status: AC
  Filled 2015-12-18: qty 20

## 2015-12-18 MED ORDER — FENTANYL CITRATE (PF) 100 MCG/2ML IJ SOLN
INTRAMUSCULAR | Status: AC
  Filled 2015-12-18: qty 4

## 2015-12-18 MED ORDER — MIDAZOLAM HCL 2 MG/2ML IJ SOLN
INTRAMUSCULAR | Status: AC
  Filled 2015-12-18: qty 2

## 2015-12-18 MED ORDER — FENTANYL CITRATE (PF) 100 MCG/2ML IJ SOLN
INTRAMUSCULAR | Status: AC | PRN
  Administered 2015-12-18 (×4): 50 ug via INTRAVENOUS

## 2015-12-18 MED ORDER — MIDAZOLAM HCL 2 MG/2ML IJ SOLN
INTRAMUSCULAR | Status: AC
  Filled 2015-12-18: qty 6

## 2015-12-18 MED ORDER — MIDAZOLAM HCL 2 MG/2ML IJ SOLN
INTRAMUSCULAR | Status: AC | PRN
  Administered 2015-12-18 (×2): 1 mg via INTRAVENOUS
  Administered 2015-12-18: 2 mg via INTRAVENOUS
  Administered 2015-12-18 (×4): 1 mg via INTRAVENOUS

## 2015-12-18 NOTE — Procedures (Signed)
Interventional Radiology Procedure Note  Procedure: Placement of a right transgluteal approach abscess drain.  Aspiration yields 40 mL bloody turbid fluid.   Complications: none  Estimated Blood Loss: none  Recommendations: - Drain to JP bulb    Signed,  Sterling BigHeath K. Mataya Kilduff, MD

## 2015-12-18 NOTE — Sedation Documentation (Signed)
Patient is resting comfortably. 

## 2015-12-18 NOTE — Sedation Documentation (Signed)
Still moving some- more meds per MD
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
## 2015-12-18 NOTE — Sedation Documentation (Signed)
Awake- trying to move

## 2015-12-19 LAB — BASIC METABOLIC PANEL
ANION GAP: 10 (ref 5–15)
BUN: 9 mg/dL (ref 6–20)
CHLORIDE: 105 mmol/L (ref 101–111)
CO2: 23 mmol/L (ref 22–32)
Calcium: 8.4 mg/dL — ABNORMAL LOW (ref 8.9–10.3)
Creatinine, Ser: 1.67 mg/dL — ABNORMAL HIGH (ref 0.61–1.24)
GFR calc Af Amer: >60 mL/min (ref >60)
GFR calc non Af Amer: 58 mL/min — ABNORMAL LOW (ref >60)
GLUCOSE: 109 mg/dL — AB (ref 65–99)
POTASSIUM: 4.4 mmol/L (ref 3.5–5.1)
Sodium: 138 mmol/L (ref 135–145)

## 2015-12-19 LAB — CBC
HEMATOCRIT: 29.4 % — AB (ref 39.0–52.0)
Hemoglobin: 9.4 g/dL — ABNORMAL LOW (ref 13.0–17.0)
MCH: 27.9 pg (ref 26.0–34.0)
MCHC: 32 g/dL (ref 30.0–36.0)
MCV: 87.2 fL (ref 78.0–100.0)
PLATELETS: 455 10*3/uL — AB (ref 150–400)
RBC: 3.37 MIL/uL — AB (ref 4.22–5.81)
RDW: 12.8 % (ref 11.5–15.5)
WBC: 17.7 10*3/uL — AB (ref 4.0–10.5)
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
## 2015-12-20 LAB — CBC
HEMATOCRIT: 28.9 % — AB (ref 39.0–52.0)
HEMOGLOBIN: 9.1 g/dL — AB (ref 13.0–17.0)
MCH: 27.7 pg (ref 26.0–34.0)
MCHC: 31.5 g/dL (ref 30.0–36.0)
MCV: 87.8 fL (ref 78.0–100.0)
Platelets: 494 10*3/uL — ABNORMAL HIGH (ref 150–400)
RBC: 3.29 MIL/uL — ABNORMAL LOW (ref 4.22–5.81)
RDW: 12.9 % (ref 11.5–15.5)
WBC: 15.9 10*3/uL — AB (ref 4.0–10.5)

## 2015-12-20 LAB — BASIC METABOLIC PANEL
ANION GAP: 11 (ref 5–15)
BUN: 7 mg/dL (ref 6–20)
CALCIUM: 8.3 mg/dL — AB (ref 8.9–10.3)
CHLORIDE: 104 mmol/L (ref 101–111)
CO2: 23 mmol/L (ref 22–32)
Creatinine, Ser: 1.61 mg/dL — ABNORMAL HIGH (ref 0.61–1.24)
GFR calc non Af Amer: >60 mL/min (ref >60)
GLUCOSE: 83 mg/dL (ref 65–99)
Potassium: 4.3 mmol/L (ref 3.5–5.1)
Sodium: 138 mmol/L (ref 135–145)
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
## 2015-12-20 NOTE — Progress Notes (Signed)
MD made aware of the critical result of a gram negative rods on the anaerobic bottle  From a drainage frm his peri rectal area.

## 2015-12-21 LAB — CBC
HCT: 28 % — ABNORMAL LOW (ref 39.0–52.0)
HEMOGLOBIN: 9.5 g/dL — AB (ref 13.0–17.0)
MCH: 29.3 pg (ref 26.0–34.0)
MCHC: 33.9 g/dL (ref 30.0–36.0)
MCV: 86.4 fL (ref 78.0–100.0)
Platelets: 472 10*3/uL — ABNORMAL HIGH (ref 150–400)
RBC: 3.24 MIL/uL — AB (ref 4.22–5.81)
RDW: 12.9 % (ref 11.5–15.5)
WBC: 16.3 10*3/uL — ABNORMAL HIGH (ref 4.0–10.5)

## 2015-12-21 LAB — GRAM STAIN

## 2015-12-21 MED ORDER — METRONIDAZOLE IN NACL 5-0.79 MG/ML-% IV SOLN
500.0000 mg | Freq: Three times a day (TID) | INTRAVENOUS | Status: DC
  Administered 2015-12-21 – 2015-12-24 (×10): 500 mg via INTRAVENOUS
  Filled 2015-12-21 (×11): qty 100

## 2015-12-21 NOTE — Progress Notes (Signed)
14 Days Post-Op  Subjective: No complaints other than mild abd pain  Objective: Vital signs in last 24 hours: Temp:  [98 F (36.7 C)-98.8 F (37.1 C)] 98.5 F (36.9 C) (03/19 0500) Pulse Rate:  [66-97] 66 (03/19 0500) Resp:  [17-18] 18 (03/19 0500) BP: (119-130)/(64-71) 119/64 mmHg (03/19 0500) SpO2:  [94 %-100 %] 94 % (03/19 0500) Last BM Date: 12/20/15  Intake/Output from previous day: 03/18 0701 - 03/19 0700 In: 1926.7 [P.O.:840; I.V.:1026.7; IV Piggyback:50] Out: 1250 [Urine:1250] Intake/Output this shift:    Abdomen soft, non distended, ostomy viable/productive Drain serosang Lungs clear  Lab Results:   Recent Labs  12/20/15 0545 12/21/15 0006  WBC 15.9* 16.3*  HGB 9.1* 9.5*  HCT 28.9* 28.0*  PLT 494* 472*   BMET  Recent Labs  12/19/15 1140 12/20/15 0545  NA 138 138  K 4.4 4.3  CL 105 104  CO2 23 23  GLUCOSE 109* 83  BUN 9 7  CREATININE 1.67* 1.61*  CALCIUM 8.4* 8.3*   PT/INR No results for input(s): LABPROT, INR in the last 72 hours. ABG No results for input(s): PHART, HCO3 in the last 72 hours.  Invalid input(s): PCO2, PO2  Studies/Results: No results found.  Anti-infectives: Anti-infectives    Start     Dose/Rate Route Frequency Ordered Stop   12/14/15 1800  vancomycin (VANCOCIN) IVPB 1000 mg/200 mL premix  Status:  Discontinued     1,000 mg 200 mL/hr over 60 Minutes Intravenous Every 8 hours 12/14/15 1747 12/17/15 1136   12/13/15 1800  vancomycin (VANCOCIN) IVPB 750 mg/150 ml premix  Status:  Discontinued     750 mg 150 mL/hr over 60 Minutes Intravenous Every 8 hours 12/13/15 0918 12/14/15 1747   12/13/15 1000  piperacillin-tazobactam (ZOSYN) IVPB 3.375 g     3.375 g 12.5 mL/hr over 240 Minutes Intravenous Every 8 hours 12/13/15 0846     12/13/15 1000  vancomycin (VANCOCIN) 1,250 mg in sodium chloride 0.9 % 250 mL IVPB     1,250 mg 166.7 mL/hr over 90 Minutes Intravenous  Once 12/13/15 0918 12/13/15 1208   12/07/15 1000  cefoTEtan  (CEFOTAN) 2 g in dextrose 5 % 50 mL IVPB     2 g 100 mL/hr over 30 Minutes Intravenous Every 12 hours 12/07/15 0646 12/07/15 1015   12/07/15 0315  cefoTEtan (CEFOTAN) 2 g in dextrose 5 % 50 mL IVPB     2 g 100 mL/hr over 30 Minutes Intravenous  Once 12/07/15 0310 12/07/15 0351      Assessment/Plan: s/p Procedure(s): EXPLORATORY LAPAROTOMY (N/A) SMALL BOWEL RESECTION (N/A) COLON RESECTION SIGMOID (N/A) COLOSTOMY (Left)  WBC increased. Cultures pending Will adjust antibiotics  LOS: 14 days    James [name] A 12/21/2015

## 2015-12-22 LAB — CBC
HCT: 30.1 % — ABNORMAL LOW (ref 39.0–52.0)
Hemoglobin: 9.6 g/dL — ABNORMAL LOW (ref 13.0–17.0)
MCH: 27.6 pg (ref 26.0–34.0)
MCHC: 31.9 g/dL (ref 30.0–36.0)
MCV: 86.5 fL (ref 78.0–100.0)
PLATELETS: 539 10*3/uL — AB (ref 150–400)
RBC: 3.48 MIL/uL — ABNORMAL LOW (ref 4.22–5.81)
RDW: 13 % (ref 11.5–15.5)
WBC: 12.9 10*3/uL — ABNORMAL HIGH (ref 4.0–10.5)

## 2015-12-22 MED ORDER — HYDROMORPHONE HCL 1 MG/ML IJ SOLN: 1.0000 mg | Freq: Once | INTRAMUSCULAR | Status: DC

## 2015-12-22 NOTE — Clinical Social Work Note (Signed)
Clinical Social Worker continuing to follow patient and family for support and discharge planning needs.  CSW spoke with patient at bedside who states that he has not seen his mother since Saturday to provide continued updates.  Patient states that he has been in contact [place] sister [place] who has agreed for patient to stay with her at discharge.  Patient states that he will either arrange transportation with a friend or his sister will come to pick him up with enough notice.  CSW offered to reach out to patient mother, however patient refused contact to be made at this time.  Per RN, patient has been caring for his Ostomy well today on his own.  CSW remains available for support and to assist patient and family as needed with discharge needs.  James [name], [place] [phone]

## 2015-12-22 NOTE — Consult Note (Signed)
WOC ostomy follow up Surgical team following for assessment and plan of care to abd wound. Stoma type/location: Colostomy intact to LLQ. Stomal assessment/size: 1 3/4 inches, stoma pink and swollen; stoma above skin level Peristomal assessment: Skin intact Output: Mod amt liquid brown stool Ostomy pouching: 2 piece  Education provided: Pt was able to perform pouch change independently while looking in the mirror. Reviewed pouching routines and ordering supplies. Pt was able to open and close to empty. He denies further questions at this time. [facility]: Yes Cammie Mcgeeawn Audwin Semper MSN, RN, Rober MinionCWOCN, [place], [place] [phone]      Revision History
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
## 2015-12-23 ENCOUNTER — Inpatient Hospital Stay (HOSPITAL_COMMUNITY): Payer: Medicaid Other

## 2015-12-23 LAB — CBC
HEMATOCRIT: 29.8 % — AB (ref 39.0–52.0)
Hemoglobin: 9.4 g/dL — ABNORMAL LOW (ref 13.0–17.0)
MCH: 27.1 pg (ref 26.0–34.0)
MCHC: 31.5 g/dL (ref 30.0–36.0)
MCV: 85.9 fL (ref 78.0–100.0)
Platelets: 541 10*3/uL — ABNORMAL HIGH (ref 150–400)
RBC: 3.47 MIL/uL — ABNORMAL LOW (ref 4.22–5.81)
RDW: 13.4 % (ref 11.5–15.5)
WBC: 10.8 10*3/uL — AB (ref 4.0–10.5)

## 2015-12-23 LAB — CULTURE, BODY FLUID W GRAM STAIN -BOTTLE

## 2015-12-23 LAB — BASIC METABOLIC PANEL
Anion gap: 9 (ref 5–15)
BUN: 10 mg/dL (ref 6–20)
CHLORIDE: 107 mmol/L (ref 101–111)
CO2: 22 mmol/L (ref 22–32)
CREATININE: 1.49 mg/dL — AB (ref 0.61–1.24)
Calcium: 8.7 mg/dL — ABNORMAL LOW (ref 8.9–10.3)
GFR calc Af Amer: >60 mL/min (ref >60)
GFR calc non Af Amer: >60 mL/min (ref >60)
GLUCOSE: 89 mg/dL (ref 65–99)
POTASSIUM: 4.5 mmol/L (ref 3.5–5.1)
SODIUM: 138 mmol/L (ref 135–145)

## 2015-12-23 LAB — CULTURE, BODY FLUID-BOTTLE

## 2015-12-23 MED ORDER — IOHEXOL 300 MG/ML  SOLN
25.0000 mL | INTRAMUSCULAR | Status: AC
  Administered 2015-12-23 (×2): 25 mL via ORAL
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
## 2015-12-24 MED ORDER — DOCUSATE SODIUM 100 MG PO CAPS
100.0000 mg | ORAL_CAPSULE | Freq: Two times a day (BID) | ORAL | Status: DC
Start: 2015-12-24 — End: 2016-06-23

## 2015-12-24 MED ORDER — METHOCARBAMOL 500 MG PO TABS: 1000.0000 mg | ORAL_TABLET | Freq: Three times a day (TID) | ORAL | Status: DC | PRN

## 2015-12-24 MED ORDER — ACETAMINOPHEN 325 MG PO TABS: 325.0000 mg | ORAL_TABLET | Freq: Four times a day (QID) | ORAL | Status: DC | PRN

## 2015-12-24 MED ORDER — POLYETHYLENE GLYCOL 3350 17 G PO PACK: 17.0000 g | PACK | Freq: Every day | ORAL | Status: DC | PRN

## 2015-12-24 MED ORDER — NAPROXEN 500 MG PO TABS: 500.0000 mg | ORAL_TABLET | Freq: Two times a day (BID) | ORAL | Status: DC

## 2015-12-24 MED ORDER — OXYCODONE HCL 5 MG PO TABS: 5.0000 mg | ORAL_TABLET | Freq: Four times a day (QID) | ORAL | Status: DC | PRN

## 2015-12-24 MED ORDER — POTASSIUM CHLORIDE IN NACL 20-0.9 MEQ/L-% IV SOLN: INTRAVENOUS | Status: DC
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
## 2015-12-24 NOTE — Clinical Social Work Note (Signed)
Clinical Social Worker continuing to follow patient and family for support and discharge planning needs.  Patient plans to discharge home today with his sister.  CM has arranged home health and patient has made transportation arrangements.  Clinical Social Worker will sign off for now as social work intervention is no longer needed. Please consult us again if new need arises.  James [name], [place] [phone]

## 2015-12-24 NOTE — Care Management Note (Signed)
Case Management Note  Patient Details  Name: James [name] MRN: [id number] Date of Birth: January 19, 1996  Subjective/Objective:  Pt medically stable for dc home today.  He plans to [place] to sister's home in [place], [place].  DC address is [address], [place], [place]  [id number].  Pt's contact phone # is [phone].  Pt will need HHRN follow up.                    Action/Plan: Home health RN arranged with [facility] in [place], [place].  Faxed pt demographics, clinical info and orders to [place] at home health agency.  Start of care Friday, 12/26/15.  HH information put on AVS in EPIC.  Bedside RN states she will have pt do his own dressing change today with her instruction, as he will have to do tomorrow.  HHRN to follow up on Friday.   Pt states his aunt will pick him up later today.    Expected Discharge Date:    12/24/2015              Expected Discharge Plan:  Home w Home Health Services  In-House Referral:     Discharge planning Services  CM Consult  Post Acute Care Choice:  Home Health Choice offered to:  Patient  DME Arranged:    DME Agency:     HH Arranged:  RN HH Agency:  Other - See comment  Status of Service:  Completed, signed off  Medicare Important Message Given:    Date Medicare IM Given:    Medicare IM give by:    Date Additional Medicare IM Given:    Additional Medicare Important Message give by:     If discussed at Long Length of Stay Meetings, dates discussed:    Additional Comments:  Quintella BatonJulie W. Ysidro Ramsay, RN, BSN  Trauma/Neuro ICU Case Manager [phone]

## 2015-12-24 NOTE — Discharge Instructions (Signed)
Ostomy Support Information  James [name] heard that people get along just fine with only one of their eyes, or one of their lungs, or one of their kidneys. 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   YOU ARE NOT ALONE.  There are nearly three quarters of a million people in the [place] who have an ostomy; people who have had surgery to remove all or part of their colons or bladders.   There is even a national association, the [place] [employer] [place] with over 350 local affiliated support groups that are organized by volunteers who provide peer support and counseling. [facility] has a toll free telephone num-ber, [phone] and an educational, interactive website, www.ostomy.org   An ostomy is an opening in the belly (abdominal wall) made by surgery. Ostomates are people who have had this procedure. The opening (stoma) allows the kidney or bowel to discharge waste. An external pouch covers the stoma to collect waste. Pouches are are a simple bag and are odor free. Different companies have disposable or reusable pouches to fit one's lifestyle. An ostomy can either be temporary or permanent.   THERE ARE THREE MAIN TYPES OF OSTOMIES  Colostomy. A colostomy is a surgically created opening in the large intestine (colon).  Ileostomy. An ileostomy is a surgically created opening in the small intestine.  Urostomy. A urostomy is a surgically created opening to divert urine away from the bladder.  OSTOMY Care  The following guidelines will make care of your colostomy easier. Keep this information close by for quick reference.  Helpful DIET hints Eat a well-balanced diet including vegetables and fresh fruits. Eat on a regular schedule. Drink at least 6 to 8  glasses of fluids daily. Eat slowly in a relaxed atmosphere. Chew your food thoroughly. Avoid chewing gum, smoking, and drinking from a straw. This will help decrease the amount of air you swallow, which may help reduce gas. Eating yogurt or drinking buttermilk may help reduce gas.  To control gas at night, do not eat after 8 p.m. This will give your bowel time to quiet down before you go to bed.  If gas is a problem, you can purchase Beano. Sprinkle Beano on the first bite of food before eating to reduce gas. It has no flavor and should not change the taste of your food. You can buy Beano over the counter at your local drugstore.  Foods like fish, onions, garlic, broccoli, asparagus, and cabbage produce odor. Although your pouch is odor-proof, if you eat these foods you may notice a stronger odor when emptying your pouch. If this is a concern, you may want to limit these foods in your diet.  If you have an ileostomy, you will have chronic diarrhea & need to drink more liquids to avoid getting dehydrated.  Consider antidiarrheal medicine like imodium (loperamide) or Lomotil to help slow down bowel movements / diarrhea into your ileostomy bag.  GETTING TO GOOD BOWEL HEALTH WITH A COLOSTOMY . Irregular bowel habits such as constipation and diarrhea can lead to many problems over time.  The goal: 3-6 small BOWEL MOVEMENTS A DAY!  To have soft, regular bowel movements:   Drink plenty of fluids, consider 4-6 tall glasses of water a day.    Controlling  diarrhea  o Switch to liquids and simpler foods for a few days to avoid stressing your intestines further. o Avoid dairy products (especially milk & ice cream) for a short time.  The intestines often can lose the ability to digest lactose when stressed. o Avoid foods that cause gassiness or bloating.  Typical foods include beans and other legumes, cabbage, broccoli, and dairy foods.  Every person has some sensitivity to other foods, so listen to our  body and avoid those foods that trigger problems for you. o Adding fiber (Citrucel, Metamucil, psyllium, Miralax) gradually can help thicken stools by absorbing excess fluid and retrain the intestines to act more normally.  Slowly increase the dose over a few weeks.  Too much fiber too soon can backfire and cause cramping & bloating. o Probiotics (such as active yogurt, Align, etc) may help repopulate the intestines and colon with normal bacteria and calm down a sensitive digestive tract.  Most studies show it to be of mild help, though, and such products can be costly. o Medicines: - Bismuth subsalicylate (ex. Kayopectate, Pepto Bismol) every 30 minutes for up to 6 doses can help control diarrhea.  Avoid if pregnant. - Loperamide (Immodium) can slow down diarrhea.  Start with two tablets (8m total) first and then try one tablet every 6 hours.  Avoid if you are having fevers or severe pain.  If you are not better or start feeling worse, stop all medicines and call your doctor for advice o Call your doctor if you are getting worse or not better.  Sometimes further testing (cultures, endoscopy, X-ray studies, bloodwork, etc) may be needed to help diagnose and treat the cause of the diarrhea.  TROUBLESHOOTING IRREGULAR BOWELS 1) Avoid extremes of bowel movements (no bad constipation/diarrhea) 2) Miralax 17gm mixed in 8oz. water or juice-daily. May use twice a day as needed  3) Gas-x,Phazyme, etc. as needed for gas & bloating.  4) Soft,bland diet. No spicy,greasy,fried foods.  5) Prilosec (omeprazole) over-the-counter as needed  6) May hold gluten/wheat products from diet to see if symptoms improve.  7)  May try probiotics (Align, Activa, etc) to help calm the bowels down 7) If symptoms become worse call back immediately.   Applying the pouching system To apply your pouch, follow these steps:  Place all your equipment close at hand before removing your pouch.  Wash your hands.  Stand or sit in  front of a mirror. Use the position that works best for you. Remember that you must keep the skin around the stoma wrinkle-free for a good seal.  Gently remove the used pouch (1-piece system) or the pouch and old wafer (2-piece system). Empty the pouch into the toilet. Save the closure clip to use again.  Wash the stoma itself and the skin around the stoma. Your stoma may bleed a little when being washed. This is normal. Rinse and pat dry. You may use a wash cloth or soft paper towels (like Bounty), mild soap (like Dial, Safeguard, or [place], and water. Avoid soaps that contain perfumes or lotions.  For a new pouch (1-piece system) or a new wafer (2-piece system), measure your stoma using the stoma guide in each box of supplies.  Trace the shape of your stoma onto the back of the new pouch or the back of the new wafer. Cut out the opening. Remove the paper backing and set it aside.  Optional: Apply a skin barrier powder to surrounding skin if it is irritated (bare or weeping),  and dust off the excess. °Optional: Apply a skin-prep wipe (such as Skin Prep or All-Kare) to the skin around the stoma, and let it dry. Do not apply this solution if the skin is irritated (red, tender, or broken) or if you have shaved around the stoma. °Optional: Apply a skin barrier paste (such as Stomahesive, Coloplast, or Premium) around the opening cut in the back of the pouch or wafer. Allow it to dry for 30 to 60 seconds. ° °Hold the pouch (1-piece system) or wafer (2-piece system) with the sticky side toward your body. Make sure the skin around the stoma is wrinkle-free. Center the opening on the stoma, then press firmly to your abdomen (Fig. 4). Look in the mirror to check if you are placing the pouch, or wafer, in the right position. For a 2-piece system, snap the pouch onto the wafer. Make sure it snaps into place securely. ° °Place your hand over the stoma and the pouch or wafer for about 30 seconds. The heat from your  hand can help the pouch or wafer stick to your skin. ° °Add deodorant (such as Super Banish or Nullo) to your pouch. Other options include food extracts such as vanilla oil and peppermint extract. Add about 10 drops of the deodorant to the pouch. Then apply the closure clamp. Note: Do not use toxic ° chemicals or commercial cleaning agents in your pouch. These substances may harm the stoma. ° °Optional: For extra seal, apply tape to all 4 sides around the pouch or wafer, as if you were framing a picture. You may use any brand of medical adhesive tape. °Change your pouch every 5 to 7 days. Change it immediately if a leak occurs.  Wash your hands afterwards. ° °If you are wearing a 2-piece system, you may use 2 new pouches per week and alternate them. Rinse the pouch with mild soap and warm water and hang it to dry for the next day. Apply the fresh pouch. Alternate the 2 pouches like this for a week. After a week, change the wafer and begin with 2 new pouches. Place the old pouches in a plastic bag, and put them in the trash. ° ° ° °Tips for colostomy care ° °Applying Your Pouch °You may stand or sit to apply your pouch. ° °Keep the skin where you apply the pouch wrinkle-free. If the skin around the pouch is wrinkled, the seal may break when your skin stretches. ° °If hair grows close to your stoma, you may trim off the hair with scissors, an electric razor, or a safety razor. ° °Always have a mirror nearby so you can get a better view of your stoma. ° °When you apply a new pouch, write the date on the adhesive tape. This will remind you of when you last changed your pouch. ° °Changing Your Pouch °The best time to change your pouch is in the morning, before eating or drinking anything. Your stoma can function at any time, but it will function more after eating or drinking. ° °Emptying Your Pouch °Empty your pouch when it is one-third full (of urine, stool, and/or gas). If you wait until your pouch is fuller than this,  it will be more difficult to empty and more noticeable. °When you empty your pouch, either put toilet paper in the toilet bowl first, or flush the toilet while you empty the pouch. This will reduce splashing. You can empty the pouch between your legs or to one side while sitting,   or while standing or stooping. If you have a 2-piece system, you can snap off the pouch to empty it. Remember that your stoma may function during this time. °If you wish to rinse your pouch after you empty it, a turkey baster can be helpful. When using a baster, squirt water up into the pouch through the opening at the °bottom. With a 2-piece system, you can snap off the pouch to rinse it. After rinsing  your pouch, empty it into the toilet. °When rinsing your pouch at home, put a few granules of Dreft soap in the rinse water. This helps lubricate and freshen your pouch. °The inside of your pouch can be sprayed with non-stick cooking oil (Pam spray). This may help reduce stool sticking to the inside of the pouch. ° °Bathing °You may shower or bathe with your pouch on or off. Remember that your stoma may function during this time. ° °The materials you use to wash your stoma and the skin around it should be clean, but they do not need to be sterile. ° °Wearing Your Pouch °During hot weather, or if you perspire a lot in general, wear a cover over your pouch. This may prevent a rash on your skin under the pouch. Pouch covers are sold at ostomy supply stores. °Wear the pouch inside your underwear for better support. °Watch your weight. Any gain or loss of 10 to 15 pounds or more can change the way your pouch fits. ° °Going Away From Home °A collapsible cup (like those that come in travel kits) or a soft plastic squirt bottle with a pull-up top (like a travel bottle for shampoo) can be used for rinsing your pouch when you are away from home. Tilt the opening of the pouch at an upward angle when using a cup to rinse. ° °Carry wet wipes or extra  tissues to use in public bathrooms. ° °Carry an extra pouching system with you at all times. ° °Never keep ostomy supplies in the glove compartment of your car. Extreme heat or cold can damage the skin barriers and adhesive wafers on the pouch. ° °When you travel, carry your ostomy supplies with you at all times. Keep them within easy reach. Do not pack ostomy supplies in baggage that will be checked or otherwise separated from you, because your baggage might be lost. If you’re traveling out of the country, it is helpful to have a letter stating that you are carrying ostomy supplies as a medical necessity. ° °If you need ostomy supplies while traveling, look in the yellow pages of the telephone book under “Surgical Supplies.” Or call the local ostomy organization to find out where supplies are available. ° °Do not let your ostomy supplies get low. Always order new pouches before you use the last one. ° °Reducing Odor °Limit foods such as broccoli, cabbage, onions, fish, and garlic in your diet to help reduce odor. °Each time you empty your pouch, carefully clean the opening of the pouch, both inside and outside, with toilet paper. °Rinse your pouch 1 or 2 times daily after you empty it (see directions for emptying your pouch and going away from home). °Add deodorant (such as Super Banish or Nullo) to your pouch. °Use air deodorizers in your bathroom. °Do not add aspirin to your pouch. Even though aspirin can help prevent odor, it could cause ulcers on your stoma. ° °When to call the doctor °Call the doctor if you have any of the following symptoms: °Purple, black,   or white stoma Severe cramps lasting more than 6 hours Severe watery discharge from the stoma lasting more than 6 hours No output from the colostomy for 3 days Excessive bleeding from your stoma Swelling of your stoma to more than 1/2-inch larger than usual Pulling inward of your stoma below skin level Severe skin irritation or deep ulcers Bulging  or other changes in your abdomen  When to call your ostomy nurse Call your ostomy/enterostomal therapy (ET) nurse if any of the following occurs: Frequent leaking of your pouching system Change in size or appearance of your stoma, causing discomfort or problems with your pouch Skin rash or rawness Weight gain or loss that causes problems with your pouch      FREQUENTLY ASKED QUESTIONS   Why havent you met any of these folks who have an ostomy?  Well, maybe you have! You just did not recognize them because an ostomy doesn't show. It can be kept secret if you wish. Why, maybe some of your best friends, office associates or neighbors have an ostomy ... you never can tell.   People facing ostomy surgery have many quality-of-life questions like:  Will you bulge? Smell? Make noises? Will you feel waste leaving your body? Will you be a captive of the toilet? Will you starve? Be a social outcast? Get/stay married? Have babies? Easily bathe, go swimming, bend over?  OK, lets look at what you can expect:   Will you bulge?  Remember, without part of the intestine or bladder, and its contents, you should have a flatter tummy than before. You can expect to wear, with little exception, what you wore before surgery ... and this in-cludes tight clothing and bathing suits.   Will you smell?  Today, thanks to modern odor proof pouching systems, you can walk into an ostomy support group meeting and not smell anything that is foul or offensive. And, for those with an ileostomy or colostomy who are concerned about odor when emptying their pouch, there are in-pouch deodorants that can be used to eliminate any waste odors that may exist.   Will you make noises?  Everyone produces gas, especially if they are an air-swallower. But intestinal sounds that occur from time to time are no differ-ent than a gurgling tummy, and quite often your clothing will muffle any sounds.   Will you feel the waste discharges?   For those with a colostomy or ileostomy there might be a slight pressure when waste leaves your body, but understand that the intestines have no nerve endings, so there will be no unpleasant sensations. Those with a urostomy will probably be unaware of any kidney drainage.   Will you be a captive of the toilet?  Immediately post-op you will spend more time in the bathroom than you will after your body recovers from surgery. Every person is different, but on average those with an ileostomy or urostomy may empty their pouches 4 to 6 times a day; a little  less if you have a colostomy. The average wear time between pouch system changes is 3 to 5 days and the changing process should take less than 30 minutes.   Will I need to be on a special diet? Most people return to their normal diet when they have recovered from surgery. Be sure to chew your food well, eat a well-balanced diet and drink plenty of fluids. If you experience problems with a certain food, wait a couple of weeks and try it again.  Will there be  odor and noises? °Pouching systems are designed to be odor-proof or odor-resistant. There are deodorants that can be used in the pouch. Medications are also available to help reduce odor. Limit gas-producing foods and carbonated beverages. You will experience less gas and fewer noises as you heal from surgery. ° °How much time will it take to care for my ostomy? °At first, you may spend a lot of time learning about your ostomy and how to take care of it. As you become more comfortable and skilled at changing the pouching system, it will take very little time to care for it.  ° °Will I be able to return to work? °People with ostomies can perform most jobs. As soon as you have healed from surgery, you should be able to return to work. Heavy lifting (more than 10 pounds) may be discouraged.  ° °What about intimacy? °Sexual relationships and intimacy are important and fulfilling aspects of your life. They  should continue after ostomy surgery. Intimacy-related concerns should be discussed openly between you and your partner.  ° °Can I wear regular clothing? °You do not need to wear special clothing. Ostomy pouches are fairly flat and barely noticeable. Elastic undergarments will not hurt the stoma or prevent the ostomy from functioning.  ° °Can I participate in sports? °An ostomy should not limit your involvement in sports. Many people with ostomies are runners, skiers, swimmers or participate in other active lifestyles. Talk with your caregiver first before doing heavy physical activity. ° °Will you starve?  °Not if you follow doctor’s orders at each stage of your post-op adjustment. There is no such thing as an “ostomy diet”. Some people with an ostomy will be able to eat and tolerate anything; others may find diffi-culty with some foods. Each person is an individual and must determine, by trial, what is best for them. A good practice for all is to drink plenty of water.  ° °Will you be a social outcast?  °Have you met anyone who has an ostomy and is a social outcast? Why should you be the first? Only your attitude and self image will effect how you are treated. No confi-dent person is an outcast.  ° ° ° °PROFESSIONAL HELP  °Resources are available if you need help or have questions about your ostomy.  ° °· Specially trained nurses called Wound, Ostomy Continence Nurses (WOCN) are available for consultation in most major medical centers. ° °· Consider getting an ostomy consult with Kathy Probst at [facility] to help troubleshoot stoma pouch fittings and other issues with your ostomy: [phone] ° °· The [employer] Ostomy Association (UOA) is a group made up of many local chapters throughout the [place]. These local groups hold meetings and provide support to prospective and existing ostomates. They sponsor educational events and have qualified visitors to make personal or telephone visits. Contact  the UOA for the chapter nearest you and for other educational publications. ° °· More detailed information can be found in Colostomy Guide, a publication of the United Ostomy Association (UOA). Contact UOA at 1-800-826-0826 or visit their web site at www.uoaa.org. The website contains links to other sites, suppliers and resources. ° °· [facility] Secure Start Services: °· Start at the website to enlist for support.  Your Wound Ostomy (WOCN) nurse may have started this process. https://www.[facility].com/en/securestart °· Secure Start services are designed to support people as they live their lives with an ostomy or neurogenic bladder. Enrolling is easy and at no cost to the   patient. We realize that each person's needs and life journey are different. Through Secure Start services, we want to help people live their life, their way.  SURGERY: POST OP INSTRUCTIONS (Surgery for small bowel obstruction, colon resection, etc)    DIET Follow a light diet the first few days at home.  Start with a bland diet such as soups, liquids, starchy foods, low fat foods, etc.  If you feel full, bloated, or constipated, stay on a ful liquid or pureed/blenderized diet for a few days until you feel better and no longer constipated. Be sure to drink plenty of fluids every day to avoid getting dehydrated (feeling dizzy, not urinating, etc.). Gradually add a fiber supplement to your diet over the next week.  Gradually get back to a regular solid diet.  Avoid fast food or heavy meals the first week as you are more likely to get nauseated. It is expected for your digestive tract to need a few months to get back to normal.  It is common for your bowel movements and stools to be irregular.  You will have occasional bloating and cramping that should eventually fade away.  Until you are eating solid food normally, off all pain medications, and back to regular activities; your bowels will not be normal. Focus on eating a low-fat, high  fiber diet the rest of your life (See Getting to [place], below).  CARE of your INCISION or WOUND It is good for closed incision and even open wounds to be washed every day.  Shower every day.  Short baths are fine.  Wash the incisions and wounds clean with soap & water.    If you have a closed incision(s), wash the incision with soap & water every day.  You may leave closed incisions open to air if it is dry.   You may cover the incision with clean gauze & replace it after your daily shower for comfort. If you have skin tapes (Steristrips) or skin glue (Dermabond) on your incision, leave them in place.  They will fall off on their own like a scab.  You may trim any edges that curl up with clean scissors.  If you have staples, set up an appointment for them to be removed in the office in 10 days after surgery.  If you have a drain, wash around the skin exit site with soap & water and place a new dressing of gauze or band aid around the skin every day.  Keep the drain site clean & dry.    If you have an open wound with packing, see wound care instructions.  In general, it is encouraged that you remove your dressing and packing, shower with soap & water, and replace your dressing once a day.  Pack the wound with clean gauze moistened with normal (0.9%) saline to keep the wound moist & uninfected.  Pressure on the dressing for 30 minutes will stop most wound bleeding.  Eventually your body will heal & pull the open wound closed over the next few months.  Raw open wounds will occasionally bleed or secrete yellow drainage until it heals closed.  Drain sites will drain a little until the drain is removed.  Even closed incisions can have mild bleeding or drainage the first few days until the skin edges scab over & seal.   If you have an open wound with a wound vac, see wound vac care instructions.     ACTIVITIES as tolerated Start light daily activities ---  self-care, walking, climbing stairs--  beginning the day after surgery.  Gradually increase activities as tolerated.  Control your pain to be active.  Stop when you are tired.  Ideally, walk several times a day, eventually an hour a day.   Most people are back to most day-to-day activities in a few weeks.  It takes 4-8 weeks to get back to unrestricted, intense activity. If you can walk 30 minutes without difficulty, it is safe to try more intense activity such as jogging, treadmill, bicycling, low-impact aerobics, swimming, etc. Save the most intensive and strenuous activity for last (Usually 4-8 weeks after surgery) such as sit-ups, heavy lifting, contact sports, etc.  Refrain from any intense heavy lifting or straining until you are off narcotics for pain control.  You will have off days, but things should improve week-by-week. DO NOT PUSH THROUGH PAIN.  Let pain be your guide: If it hurts to do something, don't do it.  Pain is your body warning you to avoid that activity for another week until the pain goes down. You may drive when you are no longer taking narcotic prescription pain medication, you can comfortably wear a seatbelt, and you can safely make sudden turns/stops to protect yourself without hesitating due to pain. You may have sexual intercourse when it is comfortable. If it hurts to do something, stop.  MEDICATIONS Take your usually prescribed home medications unless otherwise directed.   Blood thinners:  Usually you can restart any strong blood thinners after the second postoperative day.  It is OK to take aspirin right away.     If you are on strong blood thinners (warfarin/Coumadin, Plavix, Xerelto, Eliquis, Pradaxa, etc), discuss with your surgeon, medicine PCP, and/or cardiologist for instructions on when to restart the blood thinner & if blood monitoring is needed (PT/INR blood check, etc).     PAIN CONTROL Pain after surgery or related to activity is often due to strain/injury to muscle, tendon, nerves and/or  incisions.  This pain is usually short-term and will improve in a few months.  To help speed the process of healing and to get back to regular activity more quickly, DO THE FOLLOWING THINGS TOGETHER: 1. Increase activity gradually.  DO NOT PUSH THROUGH PAIN 2. Use Ice and/or Heat 3. Try Gentle Massage and/or Stretching 4. Take over the counter pain medication 5. Take Narcotic prescription pain medication for more severe pain  Good pain control = faster recovery.  It is better to take more medicine to be more active than to stay in bed all day to avoid medications. 1.  Increase activity gradually Avoid heavy lifting at first, then increase to lifting as tolerated over the next 6 weeks. Do not push through the pain.  Listen to your body and avoid positions and maneuvers than reproduce the pain.  Wait a few days before trying something more intense Walking an hour a day is encouraged to help your body recover faster and more safely.  Start slowly and stop when getting sore.  If you can walk 30 minutes without stopping or pain, you can try more intense activity (running, jogging, aerobics, cycling, swimming, treadmill, sex, sports, weightlifting, etc.) Remember: If it hurts to do it, then dont do it! 2. Use Ice and/or Heat You will have swelling and bruising around the incisions.  This will take several weeks to resolve. Ice packs or heating pads (6-8 times a day, 30-60 minutes at a time) will help sooth soreness & bruising. Some people prefer to use  ice alone, heat alone, or alternate between ice & heat.  Experiment and see what works best for you.  Consider trying ice for the first few days to help decrease swelling and bruising; then, switch to heat to help relax sore spots and speed recovery. Shower every day.  Short baths are fine.  It feels good!  Keep the incisions and wounds clean with soap & water.   3. Try Gentle Massage and/or Stretching Massage at the area of pain many times a  day Stop if you feel pain - do not overdo it 4. Take over the counter pain medication This helps the muscle and nerve tissues become less irritable and calm down faster Choose ONE of the following over-the-counter anti-inflammatory medications: Acetaminophen 549m tabs (Tylenol) 1-2 pills with every meal and just before bedtime (avoid if you have liver problems or if you have acetaminophen in you narcotic prescription) Naproxen 2269mtabs (ex. Aleve, Naprosyn) 1-2 pills twice a day (avoid if you have kidney, stomach, IBD, or bleeding problems) Ibuprofen 20057mabs (ex. Advil, Motrin) 3-4 pills with every meal and just before bedtime (avoid if you have kidney, stomach, IBD, or bleeding problems) Take with food/snack several times a day as directed for at least 2 weeks to help keep pain / soreness down & more manageable. 5. Take Narcotic prescription pain medication for more severe pain A prescription for strong pain control is often given to you upon discharge (for example: oxycodone/Percocet, hydrocodone/Norco/Vicodin, or tramadol/Ultram) Take your pain medication as prescribed. Be mindful that most narcotic prescriptions contain Tylenol (acetaminophen) as well - avoid taking too much Tylenol. If you are having problems/concerns with the prescription medicine (does not control pain, nausea, vomiting, rash, itching, etc.), please call us [phone] see if we need to switch you to a different pain medicine that will work better for you and/or control your side effects better. If you need a refill on your pain medication, you must call the office before 4 pm and on weekdays only.  By federal law, prescriptions for narcotics cannot be called into a pharmacy.  They must be filled out on paper & picked up from our office by the patient or authorized caretaker.  Prescriptions cannot be filled after 4 pm nor on weekends.   WHEN TO CALL US [phone] uncontrolled or worsening pain  Fever  over 101 F (38.5 C) Concerns with the incision: Worsening pain, redness, rash/hives, swelling, bleeding, or drainage Reactions / problems with new medications (itching, rash, hives, nausea, etc.) Nausea and/or vomiting Difficulty urinating Difficulty breathing Worsening fatigue, dizziness, lightheadedness, blurred vision Other concerns If you are not getting better after two weeks or are noticing you are getting worse, contact our office [phone] for further advice.  We may need to adjust your medications, re-evaluate you in the office, send you to the emergency room, or see what other things we can do to help. The clinic staff is available to answer your questions during regular business hours (8:30am-5pm).  Please dont hesitate to call and ask to speak to one of our nurses for clinical concerns.    A surgeon from [facility] is always on call at the hospitals 24 hours/day If you have a medical emergency, go to the nearest emergency room or call 911. FOLLOW UP in our office One the day of your discharge from the hospital (or the next business weekday), please call [facility] to set up or confirm an appointment to see  your surgeon in the office for a follow-up appointment.  Usually it is 2-3 weeks after your surgery.   If you have skin staples at your incision(s), let the office know so we can set up a time in the office for the nurse to remove them (usually around 10 days after surgery). Make sure that you call for appointments the day of discharge (or the next business weekday) from the hospital to ensure a convenient appointment time. IF YOU HAVE DISABILITY OR FAMILY LEAVE FORMS, BRING THEM TO THE OFFICE FOR PROCESSING.  DO NOT GIVE THEM TO YOUR DOCTOR.  [facility] [address], [place], [place], [place]  [address] ? [phone] - Main [phone] - [place],  [phone] - Fax [email or web address]  GETTING TO  GOOD BOWEL HEALTH. It is expected for your digestive tract to need a few months to get back to normal.  It is common for your bowel movements and stools to be irregular.  You will have occasional bloating and cramping that should eventually fade away.  Until you are eating solid food normally, off all pain medications, and back to regular activities; your bowels will not be normal.   Avoiding constipation The goal: ONE SOFT BOWEL MOVEMENT A DAY!    Drink plenty of fluids.  Choose water first. TAKE A FIBER SUPPLEMENT EVERY DAY THE REST OF YOUR LIFE During your first week back home, gradually add back a fiber supplement every day Experiment which form you can tolerate.   There are many forms such as powders, tablets, wafers, gummies, etc Psyllium bran (Metamucil), methylcellulose (Citrucel), Miralax or Glycolax, Benefiber, Flax Seed.  Adjust the dose week-by-week (1/2 dose/day to 6 doses a day) until you are moving your bowels 1-2 times a day.  Cut back the dose or try a different fiber product if it is giving you problems such as diarrhea or bloating. Sometimes a laxative is needed to help jump-start bowels if constipated until the fiber supplement can help regulate your bowels.  If you are tolerating eating & you are farting, it is okay to try a gentle laxative such as double dose MiraLax, prune juice, or Milk of Magnesia.  Avoid using laxatives too often. Stool softeners can sometimes help counteract the constipating effects of narcotic pain medicines.  It can also cause diarrhea, so avoid using for too long. If you are still constipated despite taking fiber daily, eating solids, and a few doses of laxatives, call our office. Controlling diarrhea Try drinking liquids and eating bland foods for a few days to avoid stressing your intestines further. Avoid dairy products (especially milk & ice cream) for a short time.  The intestines often can lose the ability to digest lactose when stressed. Avoid  foods that cause gassiness or bloating.  Typical foods include beans and other legumes, cabbage, broccoli, and dairy foods.  Avoid greasy, spicy, fast foods.  Every person has some sensitivity to other foods, so listen to your body and avoid those foods that trigger problems for you. Probiotics (such as active yogurt, Align, etc) may help repopulate the intestines and colon with normal bacteria and calm down a sensitive digestive tract Adding a fiber supplement gradually can help thicken stools by absorbing excess fluid and retrain the intestines to act more normally.  Slowly increase the dose over a few weeks.  Too much fiber too soon can backfire and cause cramping & bloating. It is okay to try and slow down diarrhea with a few  doses of antidiarrheal medicines.   Bismuth subsalicylate (ex. Kayopectate, Pepto Bismol) for a few doses can help control diarrhea.  Avoid if pregnant.   Loperamide (Imodium) can slow down diarrhea.  Start with one tablet ('2mg'$ ) first.  Avoid if you are having fevers or severe pain.  ILEOSTOMY PATIENTS WILL HAVE CHRONIC DIARRHEA since their colon is not in use.    Drink plenty of liquids.  You will need to drink even more glasses of water/liquid a day to avoid getting dehydrated. Record output from your ileostomy.  Expect to empty the bag every 3-4 hours at first.  Most people with a permanent ileostomy empty their bag 4-6 times at the least.   Use antidiarrheal medicine (especially Imodium) several times a day to avoid getting dehydrated.  Start with a dose at bedtime & breakfast.  Adjust up or down as needed.  Increase antidiarrheal medications as directed to avoid emptying the bag more than 8 times a day (every 3 hours). Work with your wound ostomy nurse to learn care for your ostomy.  See ostomy care instructions. TROUBLESHOOTING IRREGULAR BOWELS 1) Start with a soft & bland diet. No spicy, greasy, or fried foods.  2) Avoid gluten/wheat or dairy products from diet to see if  symptoms improve. 3) Miralax 17gm or flax seed mixed in [place]. water or juice-daily. May use 2-4 times a day as needed. 4) Gas-X, Phazyme, etc. as needed for gas & bloating.  5) Prilosec (omeprazole) over-the-counter as needed 6)  Consider probiotics (Align, Activa, etc) to help calm the bowels down  Call your doctor if you are getting worse or not getting better.  Sometimes further testing (cultures, endoscopy, X-ray studies, CT scans, bloodwork, etc.) may be needed to help diagnose and treat the cause of the diarrhea. [facility], [place], [place], [place], [place]  [address] [phone] - Main.    [phone]  - Toll Free.   [phone] - Fax [email or web address]   Gunshot Wound Gunshot wounds can cause severe bleeding, damage to soft tissues and vital organs, and broken bones (fractures). They can also lead to infection. The amount of damage depends on the location of the injury, the type of bullet, and how deep the bullet penetrated the body.  DIAGNOSIS  A gunshot wound is usually diagnosed by your history and a physical exam. X-rays, an ultrasound exam, or other imaging studies may be done to check for foreign bodies in the wound and to determine the extent of damage. TREATMENT Many times, gunshot wounds can be treated by cleaning the wound area and bullet tract and applying a sterile bandage (dressing). Stitches (sutures), skin adhesive strips, or staples may be used to close some wounds. If the injury includes a fracture, a splint may be applied to prevent movement. Antibiotic treatment may be prescribed to help prevent infection. Depending on the gunshot wound and its location, you may require surgery. This is especially true for many bullet injuries to the chest, back, abdomen, and neck. Gunshot wounds to these areas require immediate medical care. Although there may be lead bullet fragments left in your wound, this will not cause lead  poisoning. Bullets or bullet fragments are not removed if they are not causing problems. Removing them could cause more damage to the surrounding tissue. If the bullets or fragments are not very deep, they might work their way closer to the surface of the skin. This might take weeks or even years. Then, they can be removed  after applying medicine that numbs the area (local anesthetic). HOME CARE INSTRUCTIONS   Rest the injured body part for the next 2-3 days or as directed by your health care provider.  If possible, keep the injured area elevated to reduce pain and swelling.  Keep the area clean and dry. Remove or change any dressings as instructed by your health care provider.  Only take over-the-counter or prescription medicines as directed by your health care provider.  If antibiotics were prescribed, take them as directed. Finish them even if you start to feel better.  Keep all follow-up appointments. A follow-up exam is usually needed to recheck the injury within 2-3 days. SEEK IMMEDIATE MEDICAL CARE IF:  You have shortness of breath.  You have severe chest or abdominal pain.  You pass out (faint) or feel as if you may pass out.  You have uncontrolled bleeding.  You have chills or a fever.  You have nausea or vomiting.  You have redness, swelling, increasing pain, or drainage of pus at the site of the wound.  You have numbness or weakness in the injured area. This may be a sign of damage to an underlying nerve or tendon. MAKE SURE YOU:   Understand these instructions.  Will watch your condition.  Will get help right away if you are not doing well or get worse.   This information is not intended to replace advice given to you by your health care provider. Make sure you discuss any questions you have with your health care provider.   Document Released: 10/28/2004 Document Revised: 07/11/2013 Document Reviewed: 05/28/2013 Elsevier Interactive Patient Education 2016  [place] will have a scar anytime you have surgery and a cut is made in the skin or you have something removed from your skin (mole, skin cancer, cyst). Although scars are unavoidable following surgery, there are ways to minimize their appearance. It is important to follow all the instructions you receive from your caregiver about wound care. How your wound heals will influence the appearance of your scar. If you do not follow the wound care instructions as directed, complications such as infection may occur. Wound instructions include keeping the wound clean, moist, and not letting the wound form a scab. Some people form scars that are raised and lumpy (hypertrophic) or larger than the initial wound (keloidal). HOME CARE INSTRUCTIONS   Follow wound care instructions as directed.  Keep the wound clean by washing it with soap and water.  Keep the wound moist with provided antibiotic cream or petroleum jelly until completely healed. Moisten twice a day for about 2 weeks.  Get stitches (sutures) taken out at the scheduled time.  Avoid touching or manipulating your wound unless needed. Wash your hands thoroughly before and after touching your wound.  Follow all restrictions such as limits on exercise or work. This depends on where your scar is located.  Keep the scar protected from sunburn. Cover the scar with sunscreen/sunblock with SPF 30 or higher.  Gently massage the scar using a circular motion to help minimize the appearance of the scar. Do this only after the wound has closed and all the sutures have been removed.  For hypertrophic or keloidal scars, there are several ways to treat and minimize their appearance. Methods include compression therapy, intralesional corticosteroids, laser therapy, or surgery. These methods are performed by your caregiver. Remember that the scar may appear lighter or darker than your normal skin color. This difference in color  should even out with time. SEEK MEDICAL CARE IF:   You have a fever.  You develop signs of infection such as pain, redness, pus, and warmth.  You have questions or concerns.   This information is not intended to replace advice given to you by your health care provider. Make sure you discuss any questions you have with your health care provider.   Document Released: 03/10/2010 Document Revised: 12/13/2011 Document Reviewed: 04/23/2015 Elsevier Interactive Patient Education 2016 [place] will have a scar anytime you have surgery and a cut is made in the skin or you have something removed from your skin (mole, skin cancer, cyst). Although scars are unavoidable following surgery, there are ways to minimize their appearance. It is important to follow all the instructions you receive from your caregiver about wound care. How your wound heals will influence the appearance of your scar. If you do not follow the wound care instructions as directed, complications such as infection may occur. Wound instructions include keeping the wound clean, moist, and not letting the wound form a scab. Some people form scars that are raised and lumpy (hypertrophic) or larger than the initial wound (keloidal). HOME CARE INSTRUCTIONS   Follow wound care instructions as directed.  Keep the wound clean by washing it with soap and water.  Keep the wound moist with provided antibiotic cream or petroleum jelly until completely healed. Moisten twice a day for about 2 weeks.  Get stitches (sutures) taken out at the scheduled time.  Avoid touching or manipulating your wound unless needed. Wash your hands thoroughly before and after touching your wound.  Follow all restrictions such as limits on exercise or work. This depends on where your scar is located.  Keep the scar protected from sunburn. Cover the scar with sunscreen/sunblock with SPF 30 or higher.  Gently massage the scar using a  circular motion to help minimize the appearance of the scar. Do this only after the wound has closed and all the sutures have been removed.  For hypertrophic or keloidal scars, there are several ways to treat and minimize their appearance. Methods include compression therapy, intralesional corticosteroids, laser therapy, or surgery. These methods are performed by your caregiver. Remember that the scar may appear lighter or darker than your normal skin color. This difference in color should even out with time. SEEK MEDICAL CARE IF:   You have a fever.  You develop signs of infection such as pain, redness, pus, and warmth.  You have questions or concerns.   This information is not intended to replace advice given to you by your health care provider. Make sure you discuss any questions you have with your health care provider.   Document Released: 03/10/2010 Document Revised: 12/13/2011 Document Reviewed: 04/23/2015 Elsevier Interactive Patient Education [employer].

## 2015-12-24 NOTE — Progress Notes (Signed)
Discharge home.Home discharge instruction given, Patient was able to do his own dressing change prior to discharge, Staples were removed as well. Patient was picked up by family members . HHN was arranged by case management.

## 2015-12-24 NOTE — Progress Notes (Addendum)
Patient ID: James [name], male   DOB: Jun 30, 1996, 20 y.o.   MRN: [id number]   LLQ abscess drain placed 3/16  CT 3/21:IMPRESSION: Post surgery with wound healing by secondary intent.  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  When compared to the prior CT, number of gas distended small bowel loops has decrease with residual dilated jejunum measuring up to 3.2 cm. No point of obstruction identified. Appendix not visualized. No free intraperitoneal air.  Interval placement of right pelvic drainage catheter via a right trans gluteal approach with drainage of previously noted fluid collection.   Output minimal afeb wbc wnl  CCS request removal of drain  Removed at bedside without complication Clean dressing applied

## 2015-12-25 ENCOUNTER — Encounter (HOSPITAL_COMMUNITY): Payer: Self-pay | Admitting: Emergency Medicine

## 2016-01-02 ENCOUNTER — Telehealth (HOSPITAL_COMMUNITY): Payer: Self-pay

## 2016-01-02 NOTE — Telephone Encounter (Signed)
Informed of appt 4/5 at 2:15

## 2016-01-18 ENCOUNTER — Encounter (HOSPITAL_COMMUNITY): Payer: Self-pay | Admitting: Emergency Medicine

## 2016-01-18 ENCOUNTER — Emergency Department (HOSPITAL_COMMUNITY)
Admission: EM | Admit: 2016-01-18 | Discharge: 2016-01-18 | Disposition: A | Discharge status: Home / Self Care | Payer: Medicaid Other | Attending: Emergency Medicine | Admitting: Emergency Medicine

## 2016-01-18 DIAGNOSIS — Z87828 Personal history of other (healed) physical injury and trauma: Secondary | ICD-10-CM | POA: Insufficient documentation

## 2016-01-18 DIAGNOSIS — Z433 Encounter for attention to colostomy: Secondary | ICD-10-CM | POA: Insufficient documentation

## 2016-01-18 DIAGNOSIS — Z79899 Other long term (current) drug therapy: Secondary | ICD-10-CM | POA: Insufficient documentation

## 2016-01-18 DIAGNOSIS — Z791 Long term (current) use of non-steroidal anti-inflammatories (NSAID): Secondary | ICD-10-CM | POA: Insufficient documentation

## 2016-01-18 HISTORY — DX: Accidental discharge from unspecified firearms or gun, initial encounter: W34.00XA

## 2016-01-18 NOTE — ED Notes (Signed)
Pt is here visiting and ran out of colostomy bags. Pt does not have any other complaints.

## 2016-01-18 NOTE — Discharge Instructions (Signed)
Colostomy Home Guide °A colostomy is an opening for stool to leave your body when a medical condition prevents it from leaving through the usual opening (rectum). During a surgery, a piece of large intestine (colon) is brought through a hole in the abdominal wall. The new opening is called a stoma or ostomy. A bag or pouch fits over the stoma to catch stool and gas. Your stool may be liquid, somewhat pasty, or formed. °CARING FOR YOUR STOMA  °Normally, the stoma looks a lot like the inside of your cheek: pink, red, and moist. At first it may be swollen, but this swelling will decrease within 6 weeks. °Keep the skin around your stoma clean and dry. You can gently wash your stoma and the skin around your stoma in the shower with a clean, soft washcloth. If you develop any skin irritation, your caregiver may give you a stoma powder or ointment to help heal the area. Do not use any products other than those specifically given to you by your caregiver.  °Your stoma should not be uncomfortable. If you notice any stinging or burning, your pouch may be leaking, and the skin around your stoma may be coming into contact with stool. This can cause skin irritation. If you notice stinging, replace your pouch with a new one and discard the old one. °OSTOMY POUCHES  °The pouch that fits over the ostomy can be made up of either 1 or 2 pieces. A one-piece pouch has a skin barrier piece and the pouch itself in one unit. A two-piece pouch has a skin barrier with a separate pouch that snaps on and off of the skin barrier. Either way, you should empty the pouch when it is only  to ½ full. Do not let more stool or gas build up. This could cause the pouch to leak. °Some ostomy bags have a built-in gas release valve. Ostomy deodorizer (5 drops) can be put into the pouch to prevent odor. Some people use ostomy lubricant drops inside the pouch to help the stool slide out of the bag more easily and completely.  °EMPTYING YOUR OSTOMY POUCH    °You may get lessons on how to empty your pouch from a wound-ostomy nurse before you leave the hospital. Here are the basic steps: °· Wash your hands with soap and water. °· Sit far back on the toilet. °· Put several pieces of toilet paper into the toilet water. This will prevent splashing as you empty the stool into the toilet bowl. °· Unclip or unvelcro the tail end of the pouch. °· Unroll the tail and empty stool into the toilet. °· Clean the tail with toilet paper. °· Reroll the tail, and clip or velcro it closed. °· Wash your hands again. °CHANGING YOUR OSTOMY POUCH  °Change your ostomy pouch about every 3 to 4 days for the first 6 weeks, then every 5 to7 days. Always change the bag sooner if there is any leakage or you begin to notice any discomfort or irritation of the skin around the stoma. When possible, plan to change your ostomy pouch before eating or drinking as this will lessen the chance of stool coming out during the pouch change. A wound-ostomy nurse may teach you how to change your pouch before you leave the hospital. Here are the basic steps: °· Lay out your supplies. °· Wash your hands with soap and water. °· Carefully remove the old pouch. °· Wash the stoma and allow it to dry. Men may be   advised to shave any hair around the stoma very carefully. This will make the adhesive stick better. °· Use the stoma measuring guide that comes with your pouch set to decide what size hole you will need to cut in the skin barrier piece. Choose the smallest possible size that will hold the stoma but will not touch it. °· Use the guide to trace the circle on the back of the skin barrier piece. Cut out the hole. °· Hold the skin barrier piece over the stoma to make sure the hole is the correct size. °· Remove the adhesive paper backing from the skin barrier piece. °· Squeeze stoma paste around the opening of the skin barrier piece. °· Clean and dry the skin around the stoma again. °· Carefully fit the skin  barrier piece over your stoma. °· If you are using a two-piece pouch, snap the pouch onto the skin barrier piece. °· Close the tail of the pouch. °· Put your hand over the top of the skin barrier piece to help warm it for about 5 minutes, so that it conforms to your body better. °· Wash your hands again. °DIET TIPS  °· Continue to follow your usual diet. °· Drink about eight 8 oz glasses of water each day. °· You can prevent gas by eating slowly and chewing your food thoroughly. °· If you feel concerned that you have too much gas, you can cut back on gas-producing foods, such as: °¨ Spicy foods. °¨ Onions and garlic. °¨ Cruciferous vegetables (cabbage, broccoli, cauliflower, Brussels sprouts). °¨ Beans and legumes. °¨ Some cheeses. °¨ Eggs. °¨ Fish. °¨ Bubbly (carbonated) drinks. °¨ Chewing gum. °GENERAL TIPS  °· You can shower with or without the bag in place. °· Always keep the bag on if you are bathing or swimming. °· If your bag gets wet, you can dry it with a blow-dryer set to cool. °· Avoid wearing tight clothing directly over your stoma so that it does not become irritated or bleed. Tight clothing can also prevent stool from draining into the pouch. °· It is helpful to always have an extra skin barrier and pouch with you when traveling. Do not leave them anywhere too warm, as parts of them can melt. °· Do not let your seat belt rest on your stoma. Try to keep the seat belt either above or below your stoma, or use a tiny pillow to cushion it. °· You can still participate in sports, but you should avoid activities in which there is a risk of getting hit in the abdomen. °· You can still have sex. It is a good idea to empty your pouch prior to sex. Some people and their partners feel very comfortable seeing the pouch during sex. Others choose to wear lingerie or a T-shirt that covers the device. °SEEK IMMEDIATE MEDICAL CARE IF: °· You notice a change in the size or color of the stoma, especially if it becomes  very red, purple, black, or pale white. °· You have bloody stools or bleeding from the stoma. °· You have abdominal pain, nausea, vomiting, or bloating. °· There is anything unusual protruding from the stoma. °· You have irritation or red skin around the stoma. °· No stool is passing from the stoma. °· You have diarrhea (requiring more frequent than normal pouch emptying). °  °This information is not intended to replace advice given to you by your health care provider. Make sure you discuss any questions you have with your health care   provider. °  °Document Released: 09/23/2003 Document Revised: 12/13/2011 Document Reviewed: 02/17/2011 °Elsevier Interactive Patient Education ©2016 Elsevier Inc. ° °

## 2016-01-18 NOTE — ED Notes (Signed)
Declined W/C at D/C and was escorted to lobby by RN. 
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
## 2016-02-02 ENCOUNTER — Telehealth (HOSPITAL_COMMUNITY): Payer: Self-pay

## 2016-02-02 NOTE — Telephone Encounter (Signed)
Will try to get him in this Wednesday for f/u.

## 2016-03-12 ENCOUNTER — Emergency Department (HOSPITAL_COMMUNITY)
Admission: EM | Admit: 2016-03-12 | Discharge: 2016-03-13 | Disposition: A | Discharge status: Home / Self Care | Payer: Medicaid Other | Attending: Emergency Medicine | Admitting: Emergency Medicine

## 2016-03-12 ENCOUNTER — Encounter (HOSPITAL_COMMUNITY): Payer: Self-pay

## 2016-03-12 DIAGNOSIS — Z48815 Encounter for surgical aftercare following surgery on the digestive system: Secondary | ICD-10-CM | POA: Insufficient documentation

## 2016-03-12 DIAGNOSIS — Z79891 Long term (current) use of opiate analgesic: Secondary | ICD-10-CM | POA: Diagnosis not present

## 2016-03-12 DIAGNOSIS — Z791 Long term (current) use of non-steroidal anti-inflammatories (NSAID): Secondary | ICD-10-CM | POA: Insufficient documentation

## 2016-03-12 DIAGNOSIS — Z433 Encounter for attention to colostomy: Secondary | ICD-10-CM

## 2016-03-12 NOTE — ED Notes (Signed)
Called pt in waiting room no answer 

## 2016-03-12 NOTE — ED Notes (Signed)
Pt states needs a colostomy bag. Denies any pain, redness around colostomy site. Pt is from out of town, does not have any bags tonight.

## 2016-03-13 MED ORDER — PREMIER COLOSTOMY/ILEOSTOMY KIT: 1.0000 | PACK | Status: DC | PRN
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
## 2016-03-13 NOTE — Discharge Instructions (Signed)
Read the information below.  You may return to the Emergency Department at any time for worsening condition or any new symptoms that concern you.  If you develop high fevers, abdominal pain, uncontrolled vomiting, or are unable to tolerate fluids by mouth, return to the ER for a recheck.     Colostomy Home Guide A colostomy is an opening for stool to leave your body when a medical condition prevents it from leaving through the usual opening (rectum). During a surgery, a piece of large intestine (colon) is brought through a hole in the abdominal wall. The new opening is called a stoma or ostomy. A bag or pouch fits over the stoma to catch stool and gas. Your stool may be liquid, somewhat pasty, or formed. CARING FOR YOUR STOMA  Normally, the stoma looks a lot like the inside of your cheek: pink, red, and moist. At first it may be swollen, but this swelling will decrease within 6 weeks. Keep the skin around your stoma clean and dry. You can gently wash your stoma and the skin around your stoma in the shower with a clean, soft washcloth. If you develop any skin irritation, your caregiver may give you a stoma powder or ointment to help heal the area. Do not use any products other than those specifically given to you by your caregiver.  Your stoma should not be uncomfortable. If you notice any stinging or burning, your pouch may be leaking, and the skin around your stoma may be coming into contact with stool. This can cause skin irritation. If you notice stinging, replace your pouch with a new one and discard the old one. OSTOMY POUCHES  The pouch that fits over the ostomy can be made up of either 1 or 2 pieces. A one-piece pouch has a skin barrier piece and the pouch itself in one unit. A two-piece pouch has a skin barrier with a separate pouch that snaps on and off of the skin barrier. Either way, you should empty the pouch when it is only  to  full. Do not let more stool or gas build up. This could cause  the pouch to leak. Some ostomy bags have a built-in gas release valve. Ostomy deodorizer (5 drops) can be put into the pouch to prevent odor. Some people use ostomy lubricant drops inside the pouch to help the stool slide out of the bag more easily and completely.  EMPTYING YOUR OSTOMY POUCH  You may get lessons on how to empty your pouch from a wound-ostomy nurse before you leave the hospital. Here are the basic steps:  Wash your hands with soap and water.  Sit far back on the toilet.  Put several pieces of toilet paper into the toilet water. This will prevent splashing as you empty the stool into the toilet bowl.  Unclip or unvelcro the tail end of the pouch.  Unroll the tail and empty stool into the toilet.  Clean the tail with toilet paper.  Reroll the tail, and clip or velcro it closed.  Wash your hands again. CHANGING YOUR OSTOMY POUCH  Change your ostomy pouch about every 3 to 4 days for the first 6 weeks, then every 5 to7 days. Always change the bag sooner if there is any leakage or you begin to notice any discomfort or irritation of the skin around the stoma. When possible, plan to change your ostomy pouch before eating or drinking as this will lessen the chance of stool coming out during the pouch  change. A wound-ostomy nurse may teach you how to change your pouch before you leave the hospital. Here are the basic steps:  Lay out your supplies.  Wash your hands with soap and water.  Carefully remove the old pouch.  Wash the stoma and allow it to dry. Men may be advised to shave any hair around the stoma very carefully. This will make the adhesive stick better.  Use the stoma measuring guide that comes with your pouch set to decide what size hole you will need to cut in the skin barrier piece. Choose the smallest possible size that will hold the stoma but will not touch it.  Use the guide to trace the circle on the back of the skin barrier piece. Cut out the hole.  Hold the  skin barrier piece over the stoma to make sure the hole is the correct size.  Remove the adhesive paper backing from the skin barrier piece.  Squeeze stoma paste around the opening of the skin barrier piece.  Clean and dry the skin around the stoma again.  Carefully fit the skin barrier piece over your stoma.  If you are using a two-piece pouch, snap the pouch onto the skin barrier piece.  Close the tail of the pouch.  Put your hand over the top of the skin barrier piece to help warm it for about 5 minutes, so that it conforms to your body better.  Wash your hands again. DIET TIPS   Continue to follow your usual diet.  Drink about eight 8 oz glasses of water each day.  You can prevent gas by eating slowly and chewing your food thoroughly.  If you feel concerned that you have too much gas, you can cut back on gas-producing foods, such as:  Spicy foods.  Onions and garlic.  Cruciferous vegetables (cabbage, broccoli, cauliflower, Brussels sprouts).  Beans and legumes.  Some cheeses.  Eggs.  Fish.  Bubbly (carbonated) drinks.  Chewing gum. GENERAL TIPS   You can shower with or without the bag in place.  Always keep the bag on if you are bathing or swimming.  If your bag gets wet, you can dry it with a blow-dryer set to cool.  Avoid wearing tight clothing directly over your stoma so that it does not become irritated or bleed. Tight clothing can also prevent stool from draining into the pouch.  It is helpful to always have an extra skin barrier and pouch with you when traveling. Do not leave them anywhere too warm, as parts of them can melt.  Do not let your seat belt rest on your stoma. Try to keep the seat belt either above or below your stoma, or use a tiny pillow to cushion it.  You can still participate in sports, but you should avoid activities in which there is a risk of getting hit in the abdomen.  You can still have sex. It is a good idea to empty your  pouch prior to sex. Some people and their partners feel very comfortable seeing the pouch during sex. Others choose to wear lingerie or a T-shirt that covers the device. SEEK IMMEDIATE MEDICAL CARE IF:  You notice a change in the size or color of the stoma, especially if it becomes very red, purple, black, or pale white.  You have bloody stools or bleeding from the stoma.  You have abdominal pain, nausea, vomiting, or bloating.  There is anything unusual protruding from the stoma.  You have irritation or red  skin around the stoma.  No stool is passing from the stoma.  You have diarrhea (requiring more frequent than normal pouch emptying).   This information is not intended to replace advice given to you by your health care provider. Make sure you discuss any questions you have with your health care provider.   Document Released: 09/23/2003 Document Revised: 12/13/2011 Document Reviewed: 02/17/2011 Elsevier Interactive Patient Education [employer]2016 Elsevier Inc.

## 2016-03-13 NOTE — ED Notes (Signed)
See EDP assessment. Went in to DC pt and pt not in room. Pt was not given DC instructions

## 2016-03-18 ENCOUNTER — Emergency Department (HOSPITAL_COMMUNITY)
Admission: EM | Admit: 2016-03-18 | Discharge: 2016-03-18 | Disposition: A | Discharge status: Home / Self Care | Payer: Medicaid Other

## 2016-03-24 ENCOUNTER — Telehealth (HOSPITAL_COMMUNITY): Payer: Self-pay

## 2016-03-26 NOTE — Telephone Encounter (Signed)
Pt needs more ostomy bags, unfortunately this is not something I can help him with. He will be eligible for ostomy take down in a couple months.

## 2016-04-01 ENCOUNTER — Telehealth (HOSPITAL_COMMUNITY): Payer: Self-pay

## 2016-04-01 NOTE — Telephone Encounter (Signed)
Left message

## 2016-04-07 ENCOUNTER — Encounter (HOSPITAL_COMMUNITY): Payer: Self-pay

## 2016-04-07 ENCOUNTER — Emergency Department (HOSPITAL_COMMUNITY)
Admission: EM | Admit: 2016-04-07 | Discharge: 2016-04-07 | Disposition: A | Discharge status: Home / Self Care | Payer: Medicaid Other | Attending: Emergency Medicine | Admitting: Emergency Medicine

## 2016-04-07 DIAGNOSIS — Z433 Encounter for attention to colostomy: Secondary | ICD-10-CM

## 2016-04-07 NOTE — ED Notes (Signed)
Patient here requesting colostomy bags, here from out of town and out of supplies
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
## 2016-04-07 NOTE — Discharge Instructions (Signed)
Colostomy Home Guide °A colostomy is an opening for stool to leave your body when a medical condition prevents it from leaving through the usual opening (rectum). During a surgery, a piece of large intestine (colon) is brought through a hole in the abdominal wall. The new opening is called a stoma or ostomy. A bag or pouch fits over the stoma to catch stool and gas. Your stool may be liquid, somewhat pasty, or formed. °CARING FOR YOUR STOMA  °Normally, the stoma looks a lot like the inside of your cheek: pink, red, and moist. At first it may be swollen, but this swelling will decrease within 6 weeks. °Keep the skin around your stoma clean and dry. You can gently wash your stoma and the skin around your stoma in the shower with a clean, soft washcloth. If you develop any skin irritation, your caregiver may give you a stoma powder or ointment to help heal the area. Do not use any products other than those specifically given to you by your caregiver.  °Your stoma should not be uncomfortable. If you notice any stinging or burning, your pouch may be leaking, and the skin around your stoma may be coming into contact with stool. This can cause skin irritation. If you notice stinging, replace your pouch with a new one and discard the old one. °OSTOMY POUCHES  °The pouch that fits over the ostomy can be made up of either 1 or 2 pieces. A one-piece pouch has a skin barrier piece and the pouch itself in one unit. A two-piece pouch has a skin barrier with a separate pouch that snaps on and off of the skin barrier. Either way, you should empty the pouch when it is only  to ½ full. Do not let more stool or gas build up. This could cause the pouch to leak. °Some ostomy bags have a built-in gas release valve. Ostomy deodorizer (5 drops) can be put into the pouch to prevent odor. Some people use ostomy lubricant drops inside the pouch to help the stool slide out of the bag more easily and completely.  °EMPTYING YOUR OSTOMY POUCH    °You may get lessons on how to empty your pouch from a wound-ostomy nurse before you leave the hospital. Here are the basic steps: °· Wash your hands with soap and water. °· Sit far back on the toilet. °· Put several pieces of toilet paper into the toilet water. This will prevent splashing as you empty the stool into the toilet bowl. °· Unclip or unvelcro the tail end of the pouch. °· Unroll the tail and empty stool into the toilet. °· Clean the tail with toilet paper. °· Reroll the tail, and clip or velcro it closed. °· Wash your hands again. °CHANGING YOUR OSTOMY POUCH  °Change your ostomy pouch about every 3 to 4 days for the first 6 weeks, then every 5 to7 days. Always change the bag sooner if there is any leakage or you begin to notice any discomfort or irritation of the skin around the stoma. When possible, plan to change your ostomy pouch before eating or drinking as this will lessen the chance of stool coming out during the pouch change. A wound-ostomy nurse may teach you how to change your pouch before you leave the hospital. Here are the basic steps: °· Lay out your supplies. °· Wash your hands with soap and water. °· Carefully remove the old pouch. °· Wash the stoma and allow it to dry. Men may be   advised to shave any hair around the stoma very carefully. This will make the adhesive stick better. °· Use the stoma measuring guide that comes with your pouch set to decide what size hole you will need to cut in the skin barrier piece. Choose the smallest possible size that will hold the stoma but will not touch it. °· Use the guide to trace the circle on the back of the skin barrier piece. Cut out the hole. °· Hold the skin barrier piece over the stoma to make sure the hole is the correct size. °· Remove the adhesive paper backing from the skin barrier piece. °· Squeeze stoma paste around the opening of the skin barrier piece. °· Clean and dry the skin around the stoma again. °· Carefully fit the skin  barrier piece over your stoma. °· If you are using a two-piece pouch, snap the pouch onto the skin barrier piece. °· Close the tail of the pouch. °· Put your hand over the top of the skin barrier piece to help warm it for about 5 minutes, so that it conforms to your body better. °· Wash your hands again. °DIET TIPS  °· Continue to follow your usual diet. °· Drink about eight 8 oz glasses of water each day. °· You can prevent gas by eating slowly and chewing your food thoroughly. °· If you feel concerned that you have too much gas, you can cut back on gas-producing foods, such as: °¨ Spicy foods. °¨ Onions and garlic. °¨ Cruciferous vegetables (cabbage, broccoli, cauliflower, Brussels sprouts). °¨ Beans and legumes. °¨ Some cheeses. °¨ Eggs. °¨ Fish. °¨ Bubbly (carbonated) drinks. °¨ Chewing gum. °GENERAL TIPS  °· You can shower with or without the bag in place. °· Always keep the bag on if you are bathing or swimming. °· If your bag gets wet, you can dry it with a blow-dryer set to cool. °· Avoid wearing tight clothing directly over your stoma so that it does not become irritated or bleed. Tight clothing can also prevent stool from draining into the pouch. °· It is helpful to always have an extra skin barrier and pouch with you when traveling. Do not leave them anywhere too warm, as parts of them can melt. °· Do not let your seat belt rest on your stoma. Try to keep the seat belt either above or below your stoma, or use a tiny pillow to cushion it. °· You can still participate in sports, but you should avoid activities in which there is a risk of getting hit in the abdomen. °· You can still have sex. It is a good idea to empty your pouch prior to sex. Some people and their partners feel very comfortable seeing the pouch during sex. Others choose to wear lingerie or a T-shirt that covers the device. °SEEK IMMEDIATE MEDICAL CARE IF: °· You notice a change in the size or color of the stoma, especially if it becomes  very red, purple, black, or pale white. °· You have bloody stools or bleeding from the stoma. °· You have abdominal pain, nausea, vomiting, or bloating. °· There is anything unusual protruding from the stoma. °· You have irritation or red skin around the stoma. °· No stool is passing from the stoma. °· You have diarrhea (requiring more frequent than normal pouch emptying). °  °This information is not intended to replace advice given to you by your health care provider. Make sure you discuss any questions you have with your health care   provider.   Follow up with your primary care provider for re-evaluation and additional medical supplies refills. Return to the Ed if you experience redness or swelling around your colostomy site, abdominal pain, fevers, chills, vomiting.

## 2016-04-28 ENCOUNTER — Ambulatory Visit: Payer: Self-pay | Admitting: General Surgery

## 2016-04-29 ENCOUNTER — Emergency Department (HOSPITAL_COMMUNITY): Payer: Medicaid Other

## 2016-04-29 ENCOUNTER — Inpatient Hospital Stay (HOSPITAL_COMMUNITY)
Admission: EM | Admit: 2016-04-29 | Discharge: 2016-05-07 | DRG: 604 | Disposition: A | Discharge status: Rehabilitation Facility | Payer: Medicaid Other | Attending: General Surgery | Admitting: General Surgery

## 2016-04-29 ENCOUNTER — Encounter (HOSPITAL_COMMUNITY): Payer: Self-pay | Admitting: *Deleted

## 2016-04-29 DIAGNOSIS — Z933 Colostomy status: Secondary | ICD-10-CM | POA: Diagnosis not present

## 2016-04-29 DIAGNOSIS — W3400XA Accidental discharge from unspecified firearms or gun, initial encounter: Secondary | ICD-10-CM

## 2016-04-29 DIAGNOSIS — S1190XD Unspecified open wound of unspecified part of neck, subsequent encounter: Secondary | ICD-10-CM | POA: Diagnosis not present

## 2016-04-29 DIAGNOSIS — S12301D Unspecified nondisplaced fracture of fourth cervical vertebra, subsequent encounter for fracture with routine healing: Secondary | ICD-10-CM | POA: Diagnosis not present

## 2016-04-29 DIAGNOSIS — S14109A Unspecified injury at unspecified level of cervical spinal cord, initial encounter: Secondary | ICD-10-CM | POA: Diagnosis present

## 2016-04-29 DIAGNOSIS — S129XXA Fracture of neck, unspecified, initial encounter: Secondary | ICD-10-CM | POA: Diagnosis present

## 2016-04-29 DIAGNOSIS — S1193XA Puncture wound without foreign body of unspecified part of neck, initial encounter: Secondary | ICD-10-CM | POA: Diagnosis present

## 2016-04-29 DIAGNOSIS — T148 Other injury of unspecified body region: Secondary | ICD-10-CM | POA: Diagnosis present

## 2016-04-29 DIAGNOSIS — D62 Acute posthemorrhagic anemia: Secondary | ICD-10-CM

## 2016-04-29 DIAGNOSIS — R202 Paresthesia of skin: Secondary | ICD-10-CM

## 2016-04-29 DIAGNOSIS — D72829 Elevated white blood cell count, unspecified: Secondary | ICD-10-CM

## 2016-04-29 DIAGNOSIS — R29898 Other symptoms and signs involving the musculoskeletal system: Secondary | ICD-10-CM | POA: Diagnosis not present

## 2016-04-29 DIAGNOSIS — S12300B Unspecified displaced fracture of fourth cervical vertebra, initial encounter for open fracture: Secondary | ICD-10-CM | POA: Diagnosis present

## 2016-04-29 DIAGNOSIS — S14129S Central cord syndrome at unspecified level of cervical spinal cord, sequela: Secondary | ICD-10-CM | POA: Diagnosis not present

## 2016-04-29 DIAGNOSIS — E876 Hypokalemia: Secondary | ICD-10-CM

## 2016-04-29 DIAGNOSIS — S14104A Unspecified injury at C4 level of cervical spinal cord, initial encounter: Secondary | ICD-10-CM | POA: Diagnosis present

## 2016-04-29 DIAGNOSIS — Y9367 Activity, basketball: Secondary | ICD-10-CM | POA: Diagnosis not present

## 2016-04-29 DIAGNOSIS — IMO0002 Reserved for concepts with insufficient information to code with codable children: Secondary | ICD-10-CM

## 2016-04-29 DIAGNOSIS — M792 Neuralgia and neuritis, unspecified: Secondary | ICD-10-CM | POA: Diagnosis not present

## 2016-04-29 DIAGNOSIS — R203 Hyperesthesia: Secondary | ICD-10-CM | POA: Diagnosis present

## 2016-04-29 DIAGNOSIS — J189 Pneumonia, unspecified organism: Secondary | ICD-10-CM | POA: Diagnosis not present

## 2016-04-29 DIAGNOSIS — R200 Anesthesia of skin: Secondary | ICD-10-CM | POA: Diagnosis present

## 2016-04-29 DIAGNOSIS — S14104S Unspecified injury at C4 level of cervical spinal cord, sequela: Secondary | ICD-10-CM | POA: Diagnosis not present

## 2016-04-29 LAB — CBC
HCT: 35.6 % — ABNORMAL LOW (ref 39.0–52.0)
Hemoglobin: 11.9 g/dL — ABNORMAL LOW (ref 13.0–17.0)
MCH: 28.1 pg (ref 26.0–34.0)
MCHC: 33.4 g/dL (ref 30.0–36.0)
MCV: 84.2 fL (ref 78.0–100.0)
Platelets: 311 10*3/uL (ref 150–400)
RBC: 4.23 MIL/uL (ref 4.22–5.81)
RDW: 14 % (ref 11.5–15.5)
WBC: 13.5 10*3/uL — ABNORMAL HIGH (ref 4.0–10.5)

## 2016-04-29 LAB — PREPARE FRESH FROZEN PLASMA
Unit division: 0
Unit division: 0

## 2016-04-29 LAB — COMPREHENSIVE METABOLIC PANEL
ALT: 32 U/L (ref 17–63)
AST: 22 U/L (ref 15–41)
Albumin: 3.2 g/dL — ABNORMAL LOW (ref 3.5–5.0)
Alkaline Phosphatase: 70 U/L (ref 38–126)
Anion gap: 9 (ref 5–15)
BUN: 10 mg/dL (ref 6–20)
CO2: 19 mmol/L — ABNORMAL LOW (ref 22–32)
Calcium: 8.1 mg/dL — ABNORMAL LOW (ref 8.9–10.3)
Chloride: 110 mmol/L (ref 101–111)
Creatinine, Ser: 1.09 mg/dL (ref 0.61–1.24)
GFR calc Af Amer: >60 mL/min (ref >60)
GFR calc non Af Amer: >60 mL/min (ref >60)
Glucose, Bld: 128 mg/dL — ABNORMAL HIGH (ref 65–99)
Potassium: 3.2 mmol/L — ABNORMAL LOW (ref 3.5–5.1)
Sodium: 138 mmol/L (ref 135–145)
Total Bilirubin: 0.9 mg/dL (ref 0.3–1.2)
Total Protein: 6 g/dL — ABNORMAL LOW (ref 6.5–8.1)

## 2016-04-29 LAB — CDS SEROLOGY

## 2016-04-29 LAB — PROTIME-INR
INR: 1.14
Prothrombin Time: 14.6 seconds (ref 11.4–15.2)

## 2016-04-29 LAB — ABO/RH: ABO/RH(D): O POS

## 2016-04-29 LAB — MRSA PCR SCREENING: MRSA BY PCR: NEGATIVE

## 2016-04-29 LAB — ETHANOL: Alcohol, Ethyl (B): 27 mg/dL — ABNORMAL HIGH (ref <5)

## 2016-04-29 MED ORDER — OXYCODONE HCL 5 MG PO TABS
5.0000 mg | ORAL_TABLET | ORAL | Status: DC | PRN
  Administered 2016-04-29 – 2016-04-30 (×5): 5 mg via ORAL
  Filled 2016-04-29 (×5): qty 1

## 2016-04-29 MED ORDER — MORPHINE SULFATE (PF) 2 MG/ML IV SOLN
2.0000 mg | INTRAVENOUS | Status: DC | PRN
  Administered 2016-04-29 – 2016-04-30 (×2): 2 mg via INTRAVENOUS
  Administered 2016-05-01 – 2016-05-02 (×3): 4 mg via INTRAVENOUS
  Administered 2016-05-02: 2 mg via INTRAVENOUS
  Administered 2016-05-02 (×2): 4 mg via INTRAVENOUS
  Administered 2016-05-02 – 2016-05-05 (×7): 2 mg via INTRAVENOUS
  Filled 2016-04-29: qty 2
  Filled 2016-04-29 (×3): qty 1
  Filled 2016-04-29: qty 2
  Filled 2016-04-29 (×4): qty 1
  Filled 2016-04-29: qty 2

## 2016-04-29 MED ORDER — ONDANSETRON HCL 4 MG/2ML IJ SOLN: 4.0000 mg | Freq: Four times a day (QID) | INTRAMUSCULAR | Status: DC | PRN

## 2016-04-29 MED ORDER — ACETAMINOPHEN 325 MG PO TABS: 650.0000 mg | ORAL_TABLET | ORAL | Status: DC | PRN

## 2016-04-29 MED ORDER — SODIUM CHLORIDE 0.9 % IV SOLN
INTRAVENOUS | Status: DC
Start: 2016-04-29 — End: 2016-05-03
  Administered 2016-04-29 – 2016-05-03 (×3): via INTRAVENOUS

## 2016-04-29 MED ORDER — IOPAMIDOL (ISOVUE-370) INJECTION 76%
50.0000 mL | Freq: Once | INTRAVENOUS | Status: AC | PRN
  Administered 2016-04-29: 50 mL via INTRAVENOUS

## 2016-04-29 MED ORDER — ONDANSETRON HCL 4 MG PO TABS: 4.0000 mg | ORAL_TABLET | Freq: Four times a day (QID) | ORAL | Status: DC | PRN

## 2016-04-29 MED ORDER — MORPHINE SULFATE (PF) 2 MG/ML IV SOLN
2.0000 mg | INTRAVENOUS | Status: DC | PRN
  Administered 2016-04-30 (×3): 4 mg via INTRAVENOUS
  Administered 2016-05-01: 2 mg via INTRAVENOUS
  Administered 2016-05-01: 4 mg via INTRAVENOUS
  Administered 2016-05-01: 2 mg via INTRAVENOUS
  Administered 2016-05-01 – 2016-05-06 (×4): 4 mg via INTRAVENOUS
  Filled 2016-04-29: qty 2
  Filled 2016-04-29: qty 1
  Filled 2016-04-29 (×5): qty 2
  Filled 2016-04-29 (×2): qty 1
  Filled 2016-04-29: qty 2
  Filled 2016-04-29: qty 1
  Filled 2016-04-29: qty 2
  Filled 2016-04-29 (×2): qty 1
  Filled 2016-04-29 (×3): qty 2

## 2016-04-29 MED ORDER — DOCUSATE SODIUM 100 MG PO CAPS
100.0000 mg | ORAL_CAPSULE | Freq: Two times a day (BID) | ORAL | Status: DC
  Administered 2016-05-02 – 2016-05-06 (×8): 100 mg via ORAL
  Filled 2016-04-29 (×11): qty 1
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
## 2016-04-29 NOTE — ED Notes (Signed)
Pt transported to CT with RN, MD. On monitor.

## 2016-04-29 NOTE — ED Notes (Signed)
Placed patient into a gown and on a monitor 

## 2016-04-29 NOTE — ED Notes (Signed)
[employer] at bedside 

## 2016-04-30 ENCOUNTER — Inpatient Hospital Stay (HOSPITAL_COMMUNITY): Payer: Medicaid Other

## 2016-04-30 ENCOUNTER — Encounter (HOSPITAL_COMMUNITY): Payer: Self-pay | Admitting: Neurological Surgery

## 2016-04-30 LAB — TYPE AND SCREEN
ABO/RH(D): O POS
Antibody Screen: NEGATIVE
Unit division: 0
Unit division: 0

## 2016-04-30 LAB — URINALYSIS, ROUTINE W REFLEX MICROSCOPIC
Bilirubin Urine: NEGATIVE
GLUCOSE, UA: NEGATIVE mg/dL
Hgb urine dipstick: NEGATIVE
Ketones, ur: 15 mg/dL — AB
LEUKOCYTES UA: NEGATIVE
NITRITE: NEGATIVE
PH: 6 (ref 5.0–8.0)
Protein, ur: NEGATIVE mg/dL
SPECIFIC GRAVITY, URINE: 1.028 (ref 1.005–1.030)

## 2016-04-30 LAB — BLOOD PRODUCT ORDER (VERBAL) VERIFICATION

## 2016-04-30 MED ORDER — GABAPENTIN 300 MG PO CAPS
300.0000 mg | ORAL_CAPSULE | Freq: Three times a day (TID) | ORAL | Status: DC
  Administered 2016-04-30 – 2016-05-01 (×6): 300 mg via ORAL
  Filled 2016-04-30 (×6): qty 1
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
## 2016-04-30 NOTE — Progress Notes (Signed)
Per MRI tech and as previously noted pt was unable to tolerate the MRI due to the pain he is having in bilateral upper arms. They recommended being sedation to have the MRI done. This was reported the the neurosurgeon who ordered and was instructed to discontinue the MRI as having the test done would not change the plan of care. Will continue to monitor the patient and hope the Gabapentin will provide some relief to the nerve pain the patient is having.

## 2016-04-30 NOTE — Progress Notes (Signed)
Pt has extreme hyperesthesias of upper extremities.  Could not even tolerate a cloth/gown touching his hands/arms.  MRI would compress his arms/hands in the scanner.  Would most likely need pt to be anesthetized before attempting.
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
## 2016-05-01 DIAGNOSIS — R29898 Other symptoms and signs involving the musculoskeletal system: Secondary | ICD-10-CM | POA: Diagnosis present

## 2016-05-01 DIAGNOSIS — S129XXA Fracture of neck, unspecified, initial encounter: Secondary | ICD-10-CM | POA: Diagnosis present

## 2016-05-01 MED ORDER — OXYCODONE HCL 5 MG PO TABS
5.0000 mg | ORAL_TABLET | ORAL | Status: DC | PRN
  Administered 2016-05-01 (×2): 5 mg via ORAL
  Filled 2016-05-01 (×2): qty 1

## 2016-05-01 MED ORDER — ACETAMINOPHEN 500 MG PO TABS
1000.0000 mg | ORAL_TABLET | Freq: Three times a day (TID) | ORAL | Status: DC
  Administered 2016-05-01 – 2016-05-07 (×20): 1000 mg via ORAL
  Filled 2016-05-01 (×20): qty 2
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
## 2016-05-01 NOTE — Consult Note (Signed)
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 Michaell Cowing).  Believes he worked with my partner D. Engels at that time.  Is independent in care.  Last changed his pouching system on the 27th.  Does not require pouching system change today and asks that I not touch him due to numbness and tingling pain in his UEs. Stomal assessment/size: (Reported) 1 and 5/8 inches round, red, moist, raised. Peristomal assessment: Not seen Treatment options for stomal/peristomal skin: Patient uses paste; taught that we do not carry paste and that we use skin barrier rings.  He is familiar with these and states that they will be fine.  Order placed with unit Secretary for 5 pouches and 5 rings.  Bedside RN Josh to place in room upon arrival to unit. Output Not seen, Bedside RN states brown stool Ostomy pouching: 1pc.flat flexible pouching system, [facility] [place] [phone] with skin barrier ring [facility] [place] [phone] Education provided:  Enrolled patient in [employer] DC program: No  WOC nursing team will not follow, but will remain available to this patient, the nursing and medical teams.  Please re-consult if needed. Thanks, Ladona Mow, MSN, RN, GNP, [facility]  Pager# [phone]
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
## 2016-05-02 MED ORDER — GABAPENTIN 300 MG PO CAPS
600.0000 mg | ORAL_CAPSULE | Freq: Three times a day (TID) | ORAL | Status: DC
  Administered 2016-05-02 – 2016-05-07 (×17): 600 mg via ORAL
  Filled 2016-05-02 (×4): qty 2
  Filled 2016-05-02: qty 6
  Filled 2016-05-02 (×14): qty 2

## 2016-05-02 MED ORDER — ENOXAPARIN SODIUM 40 MG/0.4ML ~~LOC~~ SOLN
40.0000 mg | Freq: Every day | SUBCUTANEOUS | Status: DC
  Administered 2016-05-06: 40 mg via SUBCUTANEOUS
  Filled 2016-05-02 (×6): qty 0.4

## 2016-05-02 MED ORDER — OXYCODONE HCL 5 MG PO TABS
5.0000 mg | ORAL_TABLET | ORAL | Status: DC | PRN
  Administered 2016-05-02: 15 mg via ORAL
  Administered 2016-05-03 (×2): 10 mg via ORAL
  Administered 2016-05-03: 15 mg via ORAL
  Filled 2016-05-02 (×2): qty 3
  Filled 2016-05-02 (×2): qty 2
  Filled 2016-05-02: qty 3
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
## 2016-05-03 ENCOUNTER — Encounter (HOSPITAL_COMMUNITY): Payer: Self-pay | Admitting: *Deleted
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
## 2016-05-04 MED ORDER — OXYCODONE HCL 5 MG PO TABS
10.0000 mg | ORAL_TABLET | ORAL | Status: DC | PRN
  Administered 2016-05-05 – 2016-05-07 (×6): 20 mg via ORAL
  Filled 2016-05-04 (×6): qty 4

## 2016-05-04 MED ORDER — TRAMADOL HCL 50 MG PO TABS
100.0000 mg | ORAL_TABLET | Freq: Four times a day (QID) | ORAL | Status: DC
  Administered 2016-05-04 – 2016-05-07 (×13): 100 mg via ORAL
  Filled 2016-05-04 (×13): qty 2
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
## 2016-05-05 DIAGNOSIS — R203 Hyperesthesia: Secondary | ICD-10-CM | POA: Diagnosis present

## 2016-05-05 DIAGNOSIS — S12301D Unspecified nondisplaced fracture of fourth cervical vertebra, subsequent encounter for fracture with routine healing: Secondary | ICD-10-CM

## 2016-05-05 DIAGNOSIS — R29898 Other symptoms and signs involving the musculoskeletal system: Secondary | ICD-10-CM

## 2016-05-05 DIAGNOSIS — D72829 Elevated white blood cell count, unspecified: Secondary | ICD-10-CM

## 2016-05-05 DIAGNOSIS — IMO0002 Reserved for concepts with insufficient information to code with codable children: Secondary | ICD-10-CM

## 2016-05-05 DIAGNOSIS — Z9889 Other specified postprocedural states: Secondary | ICD-10-CM

## 2016-05-05 DIAGNOSIS — S14109A Unspecified injury at unspecified level of cervical spinal cord, initial encounter: Secondary | ICD-10-CM | POA: Diagnosis present

## 2016-05-05 DIAGNOSIS — R200 Anesthesia of skin: Secondary | ICD-10-CM | POA: Diagnosis present

## 2016-05-05 DIAGNOSIS — R202 Paresthesia of skin: Secondary | ICD-10-CM

## 2016-05-05 DIAGNOSIS — D62 Acute posthemorrhagic anemia: Secondary | ICD-10-CM

## 2016-05-05 DIAGNOSIS — S1190XD Unspecified open wound of unspecified part of neck, subsequent encounter: Secondary | ICD-10-CM

## 2016-05-05 DIAGNOSIS — W3400XD Accidental discharge from unspecified firearms or gun, subsequent encounter: Secondary | ICD-10-CM

## 2016-05-05 DIAGNOSIS — S14109D Unspecified injury at unspecified level of cervical spinal cord, subsequent encounter: Secondary | ICD-10-CM

## 2016-05-05 DIAGNOSIS — E876 Hypokalemia: Secondary | ICD-10-CM
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
## 2016-05-06 MED ORDER — POLYETHYLENE GLYCOL 3350 17 G PO PACK
17.0000 g | PACK | Freq: Every day | ORAL | Status: DC
  Administered 2016-05-06 – 2016-05-07 (×2): 17 g via ORAL
  Filled 2016-05-06 (×2): qty 1

## 2016-05-06 MED ORDER — MORPHINE SULFATE (PF) 2 MG/ML IV SOLN
2.0000 mg | INTRAVENOUS | Status: DC | PRN
  Administered 2016-05-06 – 2016-05-07 (×4): 2 mg via INTRAVENOUS
  Filled 2016-05-06 (×4): qty 1

## 2016-05-06 MED ORDER — NAPROXEN 250 MG PO TABS
500.0000 mg | ORAL_TABLET | Freq: Two times a day (BID) | ORAL | Status: DC
  Administered 2016-05-06 – 2016-05-07 (×2): 500 mg via ORAL
  Filled 2016-05-06 (×2): qty 2
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
## 2016-05-06 NOTE — Progress Notes (Signed)
Rehab admissions - I met with patient this morning and this pm.  He tells me that he is going to go back home with his uncle.  He does not want inpatient rehab admission.  I have informed the case manager of patient decision.  Call me for questions.  #[phone]
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
## 2016-05-06 NOTE — Progress Notes (Signed)
Quarter size amount of blood discharge on bed pad, pt has been using comb to scratch his buttocks. No wounds noted when assessed.

## 2016-05-06 NOTE — Progress Notes (Signed)
Trama PA made aware of pts heart rate. No new orders.
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
## 2016-05-06 NOTE — Progress Notes (Signed)
Pt in chair, eating lunch. Pt able to take medicine cup off tray and administer medications to himself. Used other arm to get soda and drink soda.

## 2016-05-06 NOTE — Progress Notes (Addendum)
Met with pt to finalize dc plans.  Pt states that after working with PT, he has had a change of heart and realizes that he really needs to go to CIR.  Called Genie with rehab:  She will revisit pt in AM to discuss possible rehab admission with pt.  Notified attending MD, PA of pt's decision regarding CIR.  Will follow.    Reinaldo Raddle, RN, BSN  Trauma/Neuro ICU Case Manager [phone]
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
## 2016-05-06 NOTE — SANE Note (Signed)
Received request to confirm patient's date of services from James [name] with the [facility] Attorney's office. The patient missed a court date this past Monday and they were ready to issue an arrest warrant.  Family insisted he was in hospital.  Spoke with HIM and confirmed that ROI form could be signed by patient for DOS and shared w/o subpeona.  Patient signed ROI form and it was scanned into [employer].  DA's office notified patient was in hospital at time of court appointment.

## 2016-05-07 ENCOUNTER — Inpatient Hospital Stay (HOSPITAL_COMMUNITY)
Admission: RE | Admit: 2016-05-07 | Discharge: 2016-05-12 | DRG: 949 | Disposition: A | Discharge status: Home / Self Care | Payer: Medicaid Other | Source: Intra-hospital | Attending: Physical Medicine & Rehabilitation | Admitting: Physical Medicine & Rehabilitation

## 2016-05-07 ENCOUNTER — Inpatient Hospital Stay (HOSPITAL_COMMUNITY)
Admission: RE | Admit: 2016-05-07 | Discharge: 2016-05-07 | Disposition: A | Discharge status: Still In Hospital | Payer: Medicaid Other | Source: Intra-hospital | Attending: Physical Medicine & Rehabilitation | Admitting: Physical Medicine & Rehabilitation

## 2016-05-07 ENCOUNTER — Inpatient Hospital Stay (HOSPITAL_COMMUNITY)
Admission: RE | Admit: 2016-05-07 | Payer: Medicaid Other | Source: Intra-hospital | Admitting: Physical Medicine & Rehabilitation

## 2016-05-07 ENCOUNTER — Encounter (HOSPITAL_COMMUNITY): Payer: Self-pay

## 2016-05-07 DIAGNOSIS — Z933 Colostomy status: Secondary | ICD-10-CM

## 2016-05-07 DIAGNOSIS — S14129S Central cord syndrome at unspecified level of cervical spinal cord, sequela: Secondary | ICD-10-CM | POA: Diagnosis not present

## 2016-05-07 DIAGNOSIS — S14129A Central cord syndrome at unspecified level of cervical spinal cord, initial encounter: Secondary | ICD-10-CM | POA: Diagnosis present

## 2016-05-07 DIAGNOSIS — J189 Pneumonia, unspecified organism: Secondary | ICD-10-CM | POA: Diagnosis present

## 2016-05-07 DIAGNOSIS — A419 Sepsis, unspecified organism: Secondary | ICD-10-CM

## 2016-05-07 DIAGNOSIS — K5900 Constipation, unspecified: Secondary | ICD-10-CM | POA: Diagnosis present

## 2016-05-07 DIAGNOSIS — W3400XD Accidental discharge from unspecified firearms or gun, subsequent encounter: Secondary | ICD-10-CM

## 2016-05-07 DIAGNOSIS — Y95 Nosocomial condition: Secondary | ICD-10-CM | POA: Diagnosis present

## 2016-05-07 DIAGNOSIS — S14104A Unspecified injury at C4 level of cervical spinal cord, initial encounter: Secondary | ICD-10-CM

## 2016-05-07 DIAGNOSIS — M792 Neuralgia and neuritis, unspecified: Secondary | ICD-10-CM | POA: Diagnosis present

## 2016-05-07 DIAGNOSIS — S14124D Central cord syndrome at C4 level of cervical spinal cord, subsequent encounter: Principal | ICD-10-CM

## 2016-05-07 DIAGNOSIS — S14104S Unspecified injury at C4 level of cervical spinal cord, sequela: Secondary | ICD-10-CM | POA: Diagnosis not present

## 2016-05-07 DIAGNOSIS — T148 Other injury of unspecified body region: Secondary | ICD-10-CM | POA: Diagnosis not present

## 2016-05-07 DIAGNOSIS — W3400XA Accidental discharge from unspecified firearms or gun, initial encounter: Secondary | ICD-10-CM

## 2016-05-07 DIAGNOSIS — Y249XXA Unspecified firearm discharge, undetermined intent, initial encounter: Secondary | ICD-10-CM

## 2016-05-07 DIAGNOSIS — S12300D Unspecified displaced fracture of fourth cervical vertebra, subsequent encounter for fracture with routine healing: Secondary | ICD-10-CM | POA: Diagnosis not present

## 2016-05-07 DIAGNOSIS — G479 Sleep disorder, unspecified: Secondary | ICD-10-CM

## 2016-05-07 DIAGNOSIS — R0602 Shortness of breath: Secondary | ICD-10-CM

## 2016-05-07 MED ORDER — NAPROXEN 250 MG PO TABS
500.0000 mg | ORAL_TABLET | Freq: Two times a day (BID) | ORAL | Status: DC
  Administered 2016-05-07 – 2016-05-12 (×9): 500 mg via ORAL
  Filled 2016-05-07 (×10): qty 2

## 2016-05-07 MED ORDER — DOCUSATE SODIUM 100 MG PO CAPS
100.0000 mg | ORAL_CAPSULE | Freq: Two times a day (BID) | ORAL | Status: DC
  Administered 2016-05-07 – 2016-05-10 (×2): 100 mg via ORAL
  Filled 2016-05-07 (×7): qty 1

## 2016-05-07 MED ORDER — ENOXAPARIN SODIUM 40 MG/0.4ML ~~LOC~~ SOLN
40.0000 mg | Freq: Every day | SUBCUTANEOUS | Status: DC
  Filled 2016-05-07 (×3): qty 0.4

## 2016-05-07 MED ORDER — ONDANSETRON HCL 4 MG/2ML IJ SOLN: 4.0000 mg | Freq: Four times a day (QID) | INTRAMUSCULAR | Status: DC | PRN

## 2016-05-07 MED ORDER — SORBITOL 70 % SOLN: 30.0000 mL | Freq: Every day | Status: DC | PRN

## 2016-05-07 MED ORDER — DIPHENHYDRAMINE HCL 25 MG PO CAPS: 25.0000 mg | ORAL_CAPSULE | Freq: Three times a day (TID) | ORAL | Status: DC | PRN

## 2016-05-07 MED ORDER — CAMPHOR-MENTHOL 0.5-0.5 % EX LOTN
TOPICAL_LOTION | Freq: Four times a day (QID) | CUTANEOUS | Status: DC | PRN
  Administered 2016-05-07: 1 via TOPICAL
  Filled 2016-05-07: qty 222

## 2016-05-07 MED ORDER — OXYCODONE HCL ER 10 MG PO T12A
20.0000 mg | EXTENDED_RELEASE_TABLET | Freq: Two times a day (BID) | ORAL | Status: DC
Start: 2016-05-07 — End: 2016-05-12
  Administered 2016-05-07 – 2016-05-12 (×8): 20 mg via ORAL
  Filled 2016-05-07 (×9): qty 2

## 2016-05-07 MED ORDER — OXYCODONE HCL 5 MG PO TABS
10.0000 mg | ORAL_TABLET | ORAL | Status: DC | PRN
  Administered 2016-05-08 (×2): 20 mg via ORAL
  Administered 2016-05-08: 10 mg via ORAL
  Administered 2016-05-08 – 2016-05-10 (×9): 20 mg via ORAL
  Filled 2016-05-07: qty 4
  Filled 2016-05-07: qty 2
  Filled 2016-05-07 (×10): qty 4

## 2016-05-07 MED ORDER — TRAMADOL HCL 50 MG PO TABS
100.0000 mg | ORAL_TABLET | Freq: Four times a day (QID) | ORAL | Status: DC
  Administered 2016-05-07 – 2016-05-12 (×17): 100 mg via ORAL
  Filled 2016-05-07 (×18): qty 2

## 2016-05-07 MED ORDER — POLYETHYLENE GLYCOL 3350 17 G PO PACK
17.0000 g | PACK | Freq: Every day | ORAL | Status: DC
  Filled 2016-05-07 (×3): qty 1

## 2016-05-07 MED ORDER — ACETAMINOPHEN 325 MG PO TABS: 325.0000 mg | ORAL_TABLET | ORAL | Status: DC | PRN

## 2016-05-07 MED ORDER — OXYCODONE HCL ER 20 MG PO T12A
20.0000 mg | EXTENDED_RELEASE_TABLET | Freq: Two times a day (BID) | ORAL | Status: DC
  Administered 2016-05-07: 20 mg via ORAL
  Filled 2016-05-07: qty 1

## 2016-05-07 MED ORDER — ONDANSETRON HCL 4 MG PO TABS: 4.0000 mg | ORAL_TABLET | Freq: Four times a day (QID) | ORAL | Status: DC | PRN

## 2016-05-07 MED ORDER — ENOXAPARIN SODIUM 40 MG/0.4ML ~~LOC~~ SOLN: 40.0000 mg | SUBCUTANEOUS | Status: DC

## 2016-05-07 MED ORDER — GABAPENTIN 300 MG PO CAPS
600.0000 mg | ORAL_CAPSULE | Freq: Three times a day (TID) | ORAL | Status: DC
  Administered 2016-05-07 – 2016-05-08 (×4): 600 mg via ORAL
  Filled 2016-05-07 (×4): qty 2

## 2016-05-07 NOTE — Progress Notes (Signed)
Rehab admissions - I met with patient this am.  He is now willing and agreeable to come to inpatient rehab.  He would like to start with 3 hours of therapy a day.  Bed available and will admit to acute inpatient rehab today.  Call me for questions.  #[phone]

## 2016-05-07 NOTE — Progress Notes (Signed)
Patient refused lovenox. Educated about vte, prevention of dvt while in hospital, patient vocalized understanding, still refused lovenox. States that SCDs hurt too much, does not want a shot. RN to notify MD when rounding.   Patient also refused RN to change colostomy bag. He states he did it himself yesterday and does not need it done now.  Will continue to monitor.

## 2016-05-07 NOTE — Interval H&P Note (Signed)
James [name] was admitted today to Inpatient Rehabilitation with the diagnosis of Central cord syndrome/C4 SCI.  The patient's history has been reviewed, patient examined, and there is no change in status.  Patient continues to be appropriate for intensive inpatient rehabilitation.  I have reviewed the patient's chart and labs.  Questions were answered to the patient's satisfaction. The PAPE has been reviewed and assessment remains appropriate.  SWARTZ,ZACHARY T 05/07/2016, 8:02 PM

## 2016-05-07 NOTE — Care Management Note (Signed)
Case Management Note  Patient Details  Name: James [name] MRN: [id number] Date of Birth: [birth year]  Subjective/Objective:  Pt medically stable for dc today.                  Action/Plan: Plan dc to [facility] later today.    Expected Discharge Date:     05/07/2016             Expected Discharge Plan:  IP Rehab Facility  In-House Referral:  Clinical Social Work  Discharge planning Services  CM Consult  Post Acute Care Choice:    Choice offered to:     DME Arranged:    DME Agency:     HH Arranged:    HH Agency:     Status of Service:  Completed, signed off  If discussed at [employer], dates discussed:    Additional Comments:  Quintella Baton, RN, BSN  Trauma/Neuro ICU Case Manager [phone]
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
## 2016-05-08 ENCOUNTER — Inpatient Hospital Stay (HOSPITAL_COMMUNITY): Payer: Medicaid Other | Admitting: Physical Therapy

## 2016-05-08 ENCOUNTER — Inpatient Hospital Stay (HOSPITAL_COMMUNITY): Payer: Medicaid Other | Admitting: Occupational Therapy
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
## 2016-05-08 NOTE — Plan of Care (Signed)
Problem: RH PAIN MANAGEMENT Goal: RH STG PAIN MANAGED AT OR BELOW PT'S PAIN GOAL Less than 8 out of 10 Outcome: Not Progressing Pain 8-10 out of 10 with scheduled and PRN meds
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
## 2016-05-09 ENCOUNTER — Inpatient Hospital Stay (HOSPITAL_COMMUNITY): Payer: Medicaid Other | Admitting: Physical Therapy

## 2016-05-09 DIAGNOSIS — Z9889 Other specified postprocedural states: Secondary | ICD-10-CM

## 2016-05-09 MED ORDER — AMITRIPTYLINE HCL 50 MG PO TABS
75.0000 mg | ORAL_TABLET | Freq: Every day | ORAL | Status: DC
  Administered 2016-05-09: 75 mg via ORAL
  Filled 2016-05-09: qty 1

## 2016-05-09 MED ORDER — GABAPENTIN 400 MG PO CAPS
1200.0000 mg | ORAL_CAPSULE | Freq: Three times a day (TID) | ORAL | Status: DC
  Administered 2016-05-09 – 2016-05-12 (×9): 1200 mg via ORAL
  Filled 2016-05-09 (×10): qty 3

## 2016-05-09 NOTE — Progress Notes (Signed)
Physical Therapy Note  Patient Details  Name: Hurman HornGeonte XXX Mahl MRN: [id number] Date of Birth: [birth year] Today's Date: 05/09/2016    Time: 900-956 56 minutes  1:1 Pt c/o pain in Rt UE, pt medicated prior to session, PT educated pt on desensitization for Rt UE.  Treatment session focused on Rt UE tasks including gross and fine motor skills. Pt able to use Rt UE for brushing teeth, washing face and grooming. Pt attempted to use Rt UE for scissors to work with changing his ostomy bag, pt with decreased fine motor coordination Rt UE, requires assist with cutting tasks. Pt able to peel stickers off and place ostomy with Rt UE.  Pt performed gait in home and controlled environments with distant supervision, no LOB with obstacle negotiations or in a busy environment. Pt performed stair negotiation with 1 handrail x 8 stairs with supervision.  UBE for Rt UE AAROM 2 mins fwd, 2 mins bkwd with pt reporting increased "tingling" in Rt UE after AAROM.  Pt improving balance and mobility, still limited by Rt UE pain.  Loeta Herst 05/09/2016, 9:57 AM
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
## 2016-05-10 ENCOUNTER — Inpatient Hospital Stay (HOSPITAL_COMMUNITY): Payer: Medicaid Other | Admitting: Occupational Therapy

## 2016-05-10 ENCOUNTER — Inpatient Hospital Stay (HOSPITAL_COMMUNITY): Payer: Medicaid Other

## 2016-05-10 ENCOUNTER — Inpatient Hospital Stay (HOSPITAL_COMMUNITY): Payer: Medicaid Other | Admitting: Physical Therapy

## 2016-05-10 DIAGNOSIS — R6510 Systemic inflammatory response syndrome (SIRS) of non-infectious origin without acute organ dysfunction: Secondary | ICD-10-CM

## 2016-05-10 DIAGNOSIS — A419 Sepsis, unspecified organism: Secondary | ICD-10-CM

## 2016-05-10 DIAGNOSIS — G479 Sleep disorder, unspecified: Secondary | ICD-10-CM

## 2016-05-10 LAB — CBC WITH DIFFERENTIAL/PLATELET
BASOS ABS: 0 10*3/uL (ref 0.0–0.1)
BASOS PCT: 0 %
Eosinophils Absolute: 0.1 10*3/uL (ref 0.0–0.7)
Eosinophils Relative: 1 %
HEMATOCRIT: 34.1 % — AB (ref 39.0–52.0)
HEMOGLOBIN: 10.7 g/dL — AB (ref 13.0–17.0)
LYMPHS PCT: 21 %
Lymphs Abs: 1.6 10*3/uL (ref 0.7–4.0)
MCH: 27.6 pg (ref 26.0–34.0)
MCHC: 31.4 g/dL (ref 30.0–36.0)
MCV: 88.1 fL (ref 78.0–100.0)
MONO ABS: 1 10*3/uL (ref 0.1–1.0)
Monocytes Relative: 13 %
NEUTROS ABS: 5.1 10*3/uL (ref 1.7–7.7)
NEUTROS PCT: 65 %
Platelets: 288 10*3/uL (ref 150–400)
RBC: 3.87 MIL/uL — ABNORMAL LOW (ref 4.22–5.81)
RDW: 13.3 % (ref 11.5–15.5)
WBC: 7.8 10*3/uL (ref 4.0–10.5)

## 2016-05-10 LAB — URINALYSIS, ROUTINE W REFLEX MICROSCOPIC
BILIRUBIN URINE: NEGATIVE
Glucose, UA: NEGATIVE mg/dL
Hgb urine dipstick: NEGATIVE
Ketones, ur: NEGATIVE mg/dL
LEUKOCYTES UA: NEGATIVE
NITRITE: NEGATIVE
PH: 6.5 (ref 5.0–8.0)
Protein, ur: NEGATIVE mg/dL
SPECIFIC GRAVITY, URINE: 1.018 (ref 1.005–1.030)

## 2016-05-10 LAB — COMPREHENSIVE METABOLIC PANEL
ALBUMIN: 2.7 g/dL — AB (ref 3.5–5.0)
ALK PHOS: 140 U/L — AB (ref 38–126)
ALT: 116 U/L — AB (ref 17–63)
AST: 75 U/L — AB (ref 15–41)
Anion gap: 7 (ref 5–15)
BILIRUBIN TOTAL: 0.7 mg/dL (ref 0.3–1.2)
BUN: 11 mg/dL (ref 6–20)
CALCIUM: 8.4 mg/dL — AB (ref 8.9–10.3)
CO2: 29 mmol/L (ref 22–32)
CREATININE: 0.8 mg/dL (ref 0.61–1.24)
Chloride: 100 mmol/L — ABNORMAL LOW (ref 101–111)
GFR calc Af Amer: >60 mL/min (ref >60)
GLUCOSE: 97 mg/dL (ref 65–99)
Potassium: 4.2 mmol/L (ref 3.5–5.1)
Sodium: 136 mmol/L (ref 135–145)
TOTAL PROTEIN: 5.9 g/dL — AB (ref 6.5–8.1)

## 2016-05-10 LAB — APTT: aPTT: 24 seconds (ref 24–36)

## 2016-05-10 LAB — PROTIME-INR
INR: 1.08
PROTHROMBIN TIME: 14 s (ref 11.4–15.2)

## 2016-05-10 LAB — LACTIC ACID, PLASMA
LACTIC ACID, VENOUS: 1 mmol/L (ref 0.5–1.9)
Lactic Acid, Venous: 0.7 mmol/L (ref 0.5–1.9)

## 2016-05-10 LAB — PROCALCITONIN: Procalcitonin: <0.1 ng/mL

## 2016-05-10 MED ORDER — SODIUM CHLORIDE 0.9 % IV BOLUS (SEPSIS)
1000.0000 mL | Freq: Once | INTRAVENOUS | Status: AC
  Administered 2016-05-10: 1000 mL via INTRAVENOUS

## 2016-05-10 MED ORDER — VANCOMYCIN HCL IN DEXTROSE 1-5 GM/200ML-% IV SOLN
1000.0000 mg | Freq: Three times a day (TID) | INTRAVENOUS | Status: DC
  Administered 2016-05-10 – 2016-05-11 (×4): 1000 mg via INTRAVENOUS
  Filled 2016-05-10 (×7): qty 200

## 2016-05-10 MED ORDER — PIPERACILLIN-TAZOBACTAM 3.375 G IVPB
3.3750 g | Freq: Three times a day (TID) | INTRAVENOUS | Status: DC
  Administered 2016-05-10 – 2016-05-11 (×4): 3.375 g via INTRAVENOUS
  Filled 2016-05-10 (×7): qty 50

## 2016-05-10 MED ORDER — VANCOMYCIN HCL 10 G IV SOLR
1750.0000 mg | Freq: Once | INTRAVENOUS | Status: AC
  Administered 2016-05-10: 1750 mg via INTRAVENOUS
  Filled 2016-05-10: qty 1750

## 2016-05-10 MED ORDER — PIPERACILLIN-TAZOBACTAM 3.375 G IVPB 30 MIN
3.3750 g | Freq: Once | INTRAVENOUS | Status: AC
  Administered 2016-05-10: 3.375 g via INTRAVENOUS
  Filled 2016-05-10: qty 50

## 2016-05-10 MED ORDER — SODIUM CHLORIDE 0.9 % IV BOLUS (SEPSIS)
1000.0000 mL | Freq: Once | INTRAVENOUS | Status: AC
Start: 2016-05-10 — End: 2016-05-10
  Administered 2016-05-10: 1000 mL via INTRAVENOUS

## 2016-05-10 NOTE — Significant Event (Signed)
Rapid Response Event Note  Overview: Time Called: 0853 Arrival Time: 0930 Event Type: Other (Comment) (sepsis)  Initial Focused Assessment: Patient worked with therapy earlier this am.  Now he is more sleepy, but easy to awake and oriented BP  112/56  HR 130  O2 sats 74% on RA, increased O2 to 4L O2 sats are 100% Lung sounds clear, decreased in the bases.  Heart tones regular.  Interventions: 1L NS given PCXR done Labs and blood cultures drawn Zosyn given BP 110/63 HR 112 2nd Liter NS given Vanc given 3rd Liter NS given BP 114/62  HR 113   Plan of Care (if not transferred): Will continue to follow up with patient and RN.  RN to call if assistance needed.   Event Summary: Name of Physician Notified: Allena Katzatel at [phone]    at          James [name], James [name]

## 2016-05-10 NOTE — Progress Notes (Signed)
Patient refused blood work scheduled for this morning. Stated he didn't want it.  Also refused for dressings to his neck to be changed.  Stated he wants them done "later".    [name], [name] M
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
## 2016-05-10 NOTE — Progress Notes (Signed)
Occupational Therapy Session Note  Patient Details  Name: James [name] MRN: [id number] Date of Birth: Oct 21, 1995  Today's Date: 05/10/2016 OT Individual Time: [id number] OT Individual Time Calculation (min): 60 min     Short Term Goals:Week 1:  OT Short Term Goal 1 (Week 1): STGs=LTGs secondary to short estimated LOS  Skilled Therapeutic Interventions/Progress Updates:    Pt seen for OT ADL bathing/dressing session. Pt asleep in supine upon arrival and with increased time agreeable to tx session. Pt's cervical brace not properly fitted, adjusted and educated regarding proper fit and importance of maintaining cervical brace integrity for brace to serve intended purpose. He ambulated throughout room with supervision- CGA without AD. Toileting task completed in standing with distant supervision. He then ambulated into walk in shower and bathed in standing utilizing grab bar for stability. Pt with decreased awareness of deficits and balance impairments. Educated throughout task recommendation for grab bars and at least distant supervision when bathing at home due to decreased standing balance and activity tolerance.  TED hose donned sitting EOB total A. Educated regarding BP management and orthostatic hypotension. Pt voiced slight dizziness upon supine> sit.  Pt returned to supine for cervical brace pads to be changed total A. He required cues throughout for reminders not to move neck while brace pads were being changed.  Pt then ambulated to sink and completed oral care with CGA.  Discussed with pt deficits in B UEs R>L, pt noted with decreased strength resulting in difficulty holding on to and manipulating items. Will cont to address at next session. TED hose donned sitting EOB total A. Educated regarding BP management and orthostatic hypotension. Pt voiced slight dizziness upon supine> sit during session.  Pt left in supine at end of session awaiting hand off to PT.  Therapy  Documentation Precautions:  Precautions Precautions: Fall, Cervical Precaution Comments: cervical collar, hypersensitivity BUE, colostomy Required Braces or Orthoses: Cervical Brace Cervical Brace: Hard collar Restrictions Weight Bearing Restrictions: No Pain: Pain Assessment Pain Score: No formal score given, voiced some discomfort in UEs with use. RN made aware.   See Function Navigator for Current Functional Status.   Therapy/Group: Individual Therapy  Lewis, Vayden Weinand C 05/10/2016, 7:35 AM

## 2016-05-10 NOTE — Progress Notes (Signed)
Occupational Therapy Note  Patient Details  Name: James [name] MRN: [id number] Date of Birth: [birth year]  Today's Date: 05/10/2016 OT Missed Time: 75 Minutes Missed Time Reason: Patient on bedrest  Checked with pt's RN prior to scheduled tx session regarding pt's current status following abnormal vitals this morning following OT session. RN requested for therapist to let pt rest and resume POC tomorrow. Cont with POC.   Lewis, Lavell Supple C 05/10/2016, 1:20 PM
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
## 2016-05-10 NOTE — Progress Notes (Signed)
Physical Therapy Session Note  Patient Details  Name: James [name] XXX Jett MRN: [id number] Date of Birth: [birth year]  Today's Date: 05/10/2016 PT Individual Time: 0835-0900 PT Individual Time Calculation (min): 25 min    Short Term Goals: Week 1:  PT Short Term Goal 1 (Week 1): = LTG due to short LOS  Skilled Therapeutic Interventions/Progress Updates:  Pt received in bed following OT session; pt very somnolent and reporting feeling "out of it" but agreeable to therapy.  Pt unable to stay awake between therapist's questions.  Vitals assessed and due to fever and abnormal Sp02 RN and PA alerted.  Therapy session held due to vitals.  Pt to be assessed and will f/u later if cleared for activity.  Therapy Documentation Precautions:  Precautions Precautions: Fall, Cervical Precaution Comments: cervical collar, hypersensitivity BUE, colostomy Required Braces or Orthoses: Cervical Brace Cervical Brace: Hard collar Restrictions Weight Bearing Restrictions: No General: PT Amount of Missed Time (min): 50 Minutes PT Missed Treatment Reason: Patient ill (Comment);Nursing care Vital Signs: Therapy Vitals Temp: (!) 101 F (38.3 C) Temp Source: Oral Pulse Rate: (!) 130 Resp: 18 BP: (!) 112/56 Patient Position (if appropriate): Lying Oxygen Therapy SpO2: 74% on room air90 % O2 Device: Nasal Cannula O2 Flow Rate (L/min): 4 L/min Pain: Pain Assessment Pain Assessment: 0-10 Pain Score: Asleep Faces Pain Scale: No hurt Pain Type: Acute pain Pain Location: Generalized (upper body) Pain Orientation: Right;Left Pain Descriptors / Indicators: Aching Pain Onset: On-going Patients Stated Pain Goal: 2 Pain Intervention(s): Medication (See eMAR)   See Function Navigator for Current Functional Status.   Therapy/Group: Individual Therapy  Edman CircleHall, Marselino Slayton [facility] 05/10/2016, 8:50 AM
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
## 2016-05-10 NOTE — Care Management Note (Signed)
[facility]patient [facility] Individual Statement of Services  Patient Name:  James [name]  Date:  05/10/2016  Welcome to the [facility]patient Rehabilitation [facility].  Our goal is to provide you with an individualized program based on your diagnosis and situation, designed to meet your specific needs.  With this comprehensive rehabilitation program, you will be expected to participate in at least 3 hours of rehabilitation therapies Monday-Friday, with modified therapy programming on the weekends.  Your rehabilitation program will include the following services:  Physical Therapy (PT), Occupational Therapy (OT), 24 hour per day rehabilitation nursing, Neuropsychology, Case Management (Social Worker), Rehabilitation Medicine, Nutrition Services and Pharmacy Services  Weekly team conferences will be held on Tuesays to discuss your progress.  Your Social Worker will talk with you frequently to get your input and to update you on team discussions.  Team conferences with you and your family in attendance may also be held.  Expected length of stay: 5-7 days  Overall anticipated outcome: modified independent  Depending on your progress and recovery, your program may change. Your Social Worker will coordinate services and will keep you informed of any changes. Your Social Worker's name and contact numbers are listed  below.  The following services may also be recommended but are not provided by the Inpatient Rehabilitation Center:   Driving Evaluations  Home Health Rehabiltiation Services  Outpatient Rehabilitation Services  Vocational Rehabilitation   Arrangements will be made to provide these services after discharge if needed.  Arrangements include referral to agencies that provide these services.  Your insurance has been verified to be:  Medicaid Your primary doctor is:  None  Pertinent information will be shared with your doctor and your insurance company.  Social Worker:  Terrace ParkLucy Cynithia Hakimi, [place]  [phone] or ([phone]) [phone]   Information discussed with and copy given to patient by: Amada JupiterHOYLE, [name], 05/10/2016, 3:57 PM

## 2016-05-10 NOTE — Progress Notes (Signed)
Patient information reviewed and entered into eRehab system by Indigo Chaddock, RN, CRRN, PPS Coordinator.  Information including medical coding and functional independence measure will be reviewed and updated through discharge.    
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
## 2016-05-11 ENCOUNTER — Inpatient Hospital Stay (HOSPITAL_COMMUNITY): Payer: Medicaid Other | Admitting: Physical Therapy

## 2016-05-11 ENCOUNTER — Inpatient Hospital Stay (HOSPITAL_COMMUNITY): Payer: Medicaid Other | Admitting: Occupational Therapy

## 2016-05-11 ENCOUNTER — Inpatient Hospital Stay (HOSPITAL_COMMUNITY): Payer: Self-pay | Admitting: Occupational Therapy

## 2016-05-11 DIAGNOSIS — J189 Pneumonia, unspecified organism: Secondary | ICD-10-CM

## 2016-05-11 DIAGNOSIS — A419 Sepsis, unspecified organism: Secondary | ICD-10-CM

## 2016-05-11 LAB — URINE CULTURE: Culture: NO GROWTH

## 2016-05-11 MED ORDER — AMITRIPTYLINE HCL 10 MG PO TABS
10.0000 mg | ORAL_TABLET | Freq: Every day | ORAL | Status: DC
  Administered 2016-05-11: 10 mg via ORAL
  Filled 2016-05-11: qty 1

## 2016-05-11 NOTE — Progress Notes (Addendum)
Physical Therapy Session Note  Patient Details  Name: James [name] MRN: [id number] Date of Birth: Sep 07, 1996  Today's Date: 05/11/2016 PT Missed Time: 60 Minutes Missed Time Reason: Patient unwilling to participate   Short Term Goals: Week 1:  PT Short Term Goal 1 (Week 1): = LTG due to short LOS  Skilled Therapeutic Interventions/Progress Updates:  Pt more awake and alert this am, sitting upright in bed receiving IV antibiotics.   When therapist entered the room pt flatly said, "I'm not doing any more therapy."  Attempted to engage pt and find out reason for refusal.  Pt states that he had his mind made up he was leaving today and so he is.  Explained to pt that he lost a day of therapy yesterday from being ill and needing to continue to practice with therapy for home.  Pt states he wasn't sick yesterday, he was "just tired."  He states he is better, his arms are better, his legs are better so why do I need to keep doing therapy.  Explained to pt that he may need longer in the hospital to finish IV antibiotics, pt shook his head and stated he was leaving.  Explained to pt that purpose of this session was to evaluate his vestibular system and dizziness; pt stating that he isn't getting dizzy anymore when he gets up.  Attempted to encourage pt to participate in functional activities or LE exercises and balance exercises but pt continued to refuse.  Alerted Child psychotherapistsocial worker, OT, primary PT, RN and PA of pt's refusal and desire to D/C today despite medical issues.   Therapy Documentation Precautions:  Precautions Precautions: Fall, Cervical Precaution Comments: cervical collar, hypersensitivity BUE, colostomy Required Braces or Orthoses: Cervical Brace Cervical Brace: Hard collar Restrictions Weight Bearing Restrictions: No General: PT Amount of Missed Time (min): 60 Minutes PT Missed Treatment Reason: Patient unwilling to participate Pain: Pain Assessment Pain Assessment: 0-10 Pain Score:  10-Worst pain ever Pain Type: Acute pain Pain Location: Arm Pain Orientation: Right;Left Pain Descriptors / Indicators: Burning;Numbness Pain Onset: On-going Pain Intervention(s): Medication (See eMAR)   See Function Navigator for Current Functional Status.    Edman CircleHall, Naziya Hegwood Faucette 05/11/2016, 10:26 AM

## 2016-05-11 NOTE — Progress Notes (Signed)
Occupational Therapy Discharge Summary  Patient Details  Name: James [name] MRN: [id number] Date of Birth: Feb 28, 1996   Patient has met 8 of 8 long term goals due to improved activity tolerance, improved balance, postural control, ability to compensate for deficits, functional use of  RIGHT upper and LEFT upper extremity and improved coordination.  Patient to discharge at overall Modified Independent level.  Pt's family/ caregivers not present for any therapy sessions during CIR stay.  Pt wore hospital gowns during duration of stay. He was able to don hospital gown mod I and don/doff socks mod I in order to meet UB/LB dressing goals.   Pt with limited engagement and participation while in [place]. He is leaving at overall mod I level for tasks that he has completed, however, due to limited engagement pt did not complete all ADLs/ therapy sessions during CIR stay.   Recommendation:  Patient would benefit from cont. OT services to address UE impairments, however, pt cont to refuse all interventions while on CIR and therefore follow up therapy not recommended.  Equipment: No equipment provided  Reasons for discharge: discharge from hospital  Patient/family agrees with progress made and goals achieved: Yes  OT Discharge Precautions/Restrictions  Precautions Precautions: Fall;Cervical Precaution Comments: hx of colostomy Required Braces or Orthoses: Cervical Brace Cervical Brace: Hard collar;At all times Restrictions Weight Bearing Restrictions: No Vision/Perception  Vision- History Baseline Vision/History: No visual deficits Patient Visual Report: No change from baseline Vision- Assessment Vision Assessment?: No apparent visual deficits  Cognition Overall Cognitive Status: Within Functional Limits for tasks assessed Arousal/Alertness: Awake/alert Orientation Level: Oriented X4 Memory: Appears intact Awareness: Appears intact Problem Solving: Appears intact Safety/Judgment:  Impaired Comments: Decreased awareness of deficits; doesn't follow cervical precautions/ recommendations at all times Sensation Sensation Light Touch: Impaired Detail Light Touch Impaired Details: Impaired RLE;Impaired LLE;Impaired LUE;Impaired RUE Additional Comments: Reports tingling in B UEs, pins/needles sensation in R wrist/ hand Coordination Gross Motor Movements are Fluid and Coordinated: No Fine Motor Movements are Fluid and Coordinated: No Motor  Motor Motor: Tetraplegia Motor - Discharge Observations: central cord syndrome UE > LE weakness Trunk/Postural Assessment  Cervical Assessment Cervical Assessment: Exceptions to [facility] (Hard collar) Thoracic Assessment Thoracic Assessment: Within Functional Limits Lumbar Assessment Lumbar Assessment: Within Functional Limits Postural Control Postural Control: Within Functional Limits  Balance Balance Balance Assessed: Yes Static Sitting Balance Static Sitting - Level of Assistance: 6: Modified independent (Device/Increase time) Dynamic Sitting Balance Dynamic Sitting - Level of Assistance: 6: Modified independent (Device/Increase time) Static Standing Balance Static Standing - Level of Assistance: 6: Modified independent (Device/Increase time) Dynamic Standing Balance Dynamic Standing - Balance Support: During functional activity;Right upper extremity supported;Left upper extremity supported Dynamic Standing - Level of Assistance: 6: Modified independent (Device/Increase time) Extremity/Trunk Assessment RUE Assessment RUE Assessment: Exceptions to [facility] (Not formally tested due to pt's refusal. Pt guarding of UE stating "it's broken" however demonstrates ability to use it functionally when cued. AROM ~90 degrees shoulder flexion) LUE Assessment LUE Assessment: Exceptions to [facility] (Not formally assessed, however, pt able to use at functional level)   See Function Navigator for Current Functional Status.  Lewis, Loel Betancur C 05/11/2016,  3:29 PM

## 2016-05-11 NOTE — Progress Notes (Signed)
Occupational Therapy Session Note  Patient Details  Name: James [name] XXX [place] MRN: [id number] Date of Birth: 06/09/1996  Today's Date: 05/11/2016 OT Individual Time: [id number] OT Individual Time Calculation (min): 56 min     Short Term Goals:Week 1:  OT Short Term Goal 1 (Week 1): STGs=LTGs secondary to short estimated LOS  Skilled Therapeutic Interventions/Progress Updates:  Pt seen for OT session focusing on ADL re-training, functional transfers, and UE strengthening/ ROM. Pt in supine upon arrival and with encouragement agreeable to tx session. He required assist for positioning cervical brace correctly  He ambulated throughout room at supervision- mod I level managing IV pole. He stood at toilet to complete ostomy care mod I.  He ambulated to ADL apartment where he completed simulated tub/shower transfer stepping over tub wall. Educated regarding shower seat and grab bars, which pt has in his home for his uncle, however, pt cont states " I don't need it and won't use it".  Discussed having pt's uncle come in for family training of how to change cervical brace pads following shower. Pt stated, "he doesn't need to, I can change it myself". Educated on importance of staying completely still and flat when brace is removed and need for assist, however, pt cont to decline.  In therapy gym worked on finger strengthening using theraputty as well as shoulder ROM with towel pushes and wall crawls.  During middle of UE exercises pt states "I'm over therapy, I want to go home". From that point pt refused remainder of therapy and ambulated back to room. Cont to educate regarding pt's medical need for rehab and to cont with therapy goals, however, pt cont to refuse remainder of tx session.  Pt left sitting EOB with all needs in reach.   Therapy Documentation Precautions:  Precautions Precautions: Fall, Cervical Precaution Comments: cervical collar, hypersensitivity BUE, colostomy Required Braces or  Orthoses: Cervical Brace Cervical Brace: Hard collar Restrictions Weight Bearing Restrictions: No Pain: Pain Assessment Pain Assessment: 0-10 Pain Score: 10-Worst pain ever (patient was asleep upon entering room) Pain Type: Acute pain Pain Location: Generalized Pain Orientation: Right;Left Pain Descriptors / Indicators: Aching Pain Onset: On-going Pain Intervention(s): Medication (See eMAR), RN aware, repositioned, rest  See Function Navigator for Current Functional Status.   Therapy/Group: Individual Therapy  Lewis, Leba Tibbitts C 05/11/2016, 6:58 AM

## 2016-05-11 NOTE — Progress Notes (Addendum)
Pharmacy received call from [facility] lab [facility] from [facility] blood cx obtained on admission. BCID was unable to be run on the sample due to the small amount of blood obtained so unknown significance of positive gram stain.  At present the other blood culture obtained on 8/7 remains negative and pt is currently on Zosyn and vancomycin for treatment of pneumonia.  Will not make any changes to antibiotic regimen but recommend repeating blood cultures to determine significance of positive culture (real vs contaminant).  James SamplesAndy Shamekia [name], PharmD, BCPS 05/11/2016, 7:34 PM Pager: [phone]

## 2016-05-11 NOTE — Progress Notes (Signed)
Social Work Patient ID: James [name], male   DOB: 08-31-1996, 20 y.o.   MRN: [id number]   Met with pt to review team conference.  Flat affect and minimal eye contact with me still.  Explained that team is aware that he wishes to d/c today, however, we do not yet have medical clearance.  Plan for f/u CXR and that, if this is stable, then he can d/c tomorrow.  Pt reluctantly agrees to this.  Questioned if his uncle would be the one to pick him up and he nods "yes".  Have left a message for uncle to contact me.  Left information about his d/c tentatively for tomorrow and need to train someone on don/doffing the neck brace. Will continue to follow.  Fae Blossom, LCSW

## 2016-05-11 NOTE — Progress Notes (Signed)
Physical Therapy Session Note  Patient Details  Name: James [name] MRN: [id number] Date of Birth: 15-Aug-1996  Today's Date: 05/11/2016  Pt missed 60 minutes of scheduled make up PT treatment time 2/2 pt refusal.   General: PT Amount of Missed Time (min): 60 Minutes PT Missed Treatment Reason: Patient unwilling to participate   See Function Navigator for Current Functional Status.   Therapy/Group: Individual Therapy  Sandi MariscalVictoria M Miller 05/11/2016, 5:07 PM
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
## 2016-05-11 NOTE — Progress Notes (Signed)
Occupational Therapy Note  Patient Details  Name: James [name] MRN: [id number] Date of Birth: 06-06-1996  Today's Date: 05/11/2016 OT Missed Time: 60 Minutes Missed Time Reason: Patient unwilling/refused to participate without medical reason Pt in supine upon arrival. He refused all attempts of therapy this afternoon. Encouraged pt to practice changing cervical brace pads as he states he will be the one doing this at d/c, despite therapists recommendation to have assist for this. Pt cont to decline. Pt left in supine, all needs in reach.    Lewis, Lesbia Ottaway C 05/11/2016, 3:16 PM
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
## 2016-05-11 NOTE — Progress Notes (Addendum)
Physical Therapy Note  Patient Details  Name: Lillie ColumbiaGeonte Rolison MRN: [id number] Date of Birth: 11-May-1996 Today's Date: 05/11/2016    Pt politely declined participation in therapy session despite education regarding importance of participation; reports he is having pain however knows he is not due for pain medication at this time. Will continue to follow as schedule allows to prepare pt for d/c home.     Carolynn Commentlizabeth J Tygielski 05/11/2016, 2:53 PM

## 2016-05-11 NOTE — Discharge Instructions (Signed)
Inpatient Rehab Discharge Instructions  Lillie ColumbiaGeonte Weimer Discharge date and time: No discharge date for patient encounter.   Activities/Precautions/ Functional Status: Activity: Cervical collar at all times Diet: regular diet Wound Care: keep wound clean and dry Functional status:  ___ No restrictions     ___ Walk up steps independently ___ 24/7 supervision/assistance   ___ Walk up steps with assistance ___ Intermittent supervision/assistance  ___ Bathe/dress independently ___ Walk with walker     _x__ Bathe/dress with assistance ___ Walk Independently    ___ Shower independently ___ Walk with assistance    ___ Shower with assistance ___ No alcohol     ___ Return to work/school ________  Special Instructions:    My questions have been answered and I understand these instructions. I will adhere to these goals and the provided educational materials after my discharge from the hospital.  Patient/Caregiver Signature _______________________________ Date __________  Clinician Signature _______________________________________ Date __________  Please bring this form and your medication list with you to all your follow-up doctor's appointments.

## 2016-05-12 ENCOUNTER — Inpatient Hospital Stay (HOSPITAL_COMMUNITY): Payer: Medicaid Other | Admitting: Occupational Therapy

## 2016-05-12 ENCOUNTER — Inpatient Hospital Stay (HOSPITAL_COMMUNITY): Payer: Medicaid Other

## 2016-05-12 MED ORDER — LEVOFLOXACIN 750 MG PO TABS
750.0000 mg | ORAL_TABLET | Freq: Every day | ORAL | Status: DC
  Filled 2016-05-12: qty 1

## 2016-05-12 MED ORDER — AMITRIPTYLINE HCL 10 MG PO TABS: 10.0000 mg | ORAL_TABLET | Freq: Every day | ORAL | 0 refills | Status: DC

## 2016-05-12 MED ORDER — GABAPENTIN 400 MG PO CAPS: 1200.0000 mg | ORAL_CAPSULE | Freq: Three times a day (TID) | ORAL | 0 refills | Status: DC

## 2016-05-12 MED ORDER — OXYCODONE HCL 10 MG PO TABS: 10.0000 mg | ORAL_TABLET | ORAL | 0 refills | Status: DC | PRN

## 2016-05-12 MED ORDER — OXYCODONE HCL ER 20 MG PO T12A: 20.0000 mg | EXTENDED_RELEASE_TABLET | Freq: Two times a day (BID) | ORAL | 0 refills | Status: DC

## 2016-05-12 MED ORDER — NAPROXEN 500 MG PO TABS: 500.0000 mg | ORAL_TABLET | Freq: Two times a day (BID) | ORAL | 0 refills | Status: DC

## 2016-05-12 MED ORDER — LEVOFLOXACIN 500 MG PO TABS: 250.0000 mg | ORAL_TABLET | Freq: Every day | ORAL | Status: DC

## 2016-05-12 MED ORDER — LEVOFLOXACIN 250 MG PO TABS: 250.0000 mg | ORAL_TABLET | Freq: Every day | ORAL | 0 refills | Status: DC

## 2016-05-12 NOTE — Plan of Care (Signed)
Problem: RH PAIN MANAGEMENT Goal: RH STG PAIN MANAGED AT OR BELOW PT'S PAIN GOAL Less than 8 out of 10  Outcome: Not Progressing Patient rates pain a 10/10 on my shift

## 2016-05-12 NOTE — Progress Notes (Signed)
Physical Therapy Discharge Summary  Patient Details  Name: James [name] MRN: [id number] Date of Birth: 11-Dec-1995  Today's Date: 05/12/2016   Patient has met 9 of 10 long term goals due to improved activity tolerance, improved balance, improved postural control, increased strength, ability to compensate for deficits and functional use of  right upper extremity and left upper extremity.  Patient to discharge at an ambulatory level Modified Independent.     Pt with limited engagement and participation while in [facility]. He is leaving at overall mod I level for tasks that he has completed, however, due to limited engagement pt did not complete all mobility tasks/ therapy sessions during CIR stay.   Reasons goals not met: Stair goal not met due to pt refusal to perform for assessment during last treatment sessions.   Recommendation:  Patient would benefit from ongoing skilled PT services in outpatient setting to continue to advance safe functional mobility, address ongoing impairments in BUE strength, coordination, and minimize fall risk.  Equipment: No equipment provided  Reasons for discharge: refusal of 3 consecutive treatment sessions without medical reason  Patient/family agrees with progress made and goals achieved: Yes   See Function Navigator for Current Functional Status.  [name] 05/12/2016, 7:51 AM
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
## 2016-05-12 NOTE — Progress Notes (Signed)
Social Work  Discharge Note  The overall goal for the admission was met for:   Discharge location: Yes - returning home with uncle  Length of Stay: Yes - 5 days  Discharge activity level: Yes - independent  Home/community participation: Yes  Services provided included: MD, RD, PT, OT, RN, Pharmacy and SW  Financial Services: Medicaid  Follow-up services arranged: NA - no follow up services or DME recommended by tx team  Comments (or additional information):  PT was resistant to CIR program throughout his stay.  He is noted to make statements about his plan to don/doff cervical brace on his own despite therapies and PA advising against this.  He also insists he plans to drive "as soon as I get out of here" and that he "don't need to be able to turn my head to drive.  I got side mirrors."  I did attempt to reach pt's uncle and never received return contact from him.  In my VMs to uncle I did alert to tx team concerns about pt's plans.  Patient/Family verbalized understanding of follow-up arrangements: NA  Individual responsible for coordination of the follow-up plan: pt  Confirmed correct DME delivered: NA  Cobe Viney

## 2016-05-12 NOTE — Progress Notes (Signed)
Pt. Got d/c instructions papers,Pt. Ready to go home,he verbalized his ride is in the lobby and request to be wheeled there by NT.

## 2016-05-12 NOTE — Progress Notes (Signed)
RN went to start IV antibiotics and to find patient had removed IV and turned the pump off. Pt said his arms were too sore to start another IV right now. RN educated on the importance of antibiotics and pt still refused. Will continue to monitor. Royston CowperIsley, Marjarie Irion E, RN

## 2016-05-12 NOTE — Progress Notes (Signed)
Discharge papers have been provided. Patient stated his car was in the parking deck and he would drive himself home. Discussed at length no driving and someone would have to pick the patient up to go home as well as discussed at length the need to maintain cervical collar as patient had made numerous comments of removing collar.
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
## 2016-05-12 NOTE — Discharge Summary (Signed)
Discharge summary job # [id number]

## 2016-05-13 ENCOUNTER — Telehealth: Payer: Self-pay | Admitting: *Deleted

## 2016-05-13 LAB — CULTURE, BLOOD (ROUTINE X 2)

## 2016-05-13 NOTE — Telephone Encounter (Signed)
Patient returned phone call, I confirmed appointment date and time, I confirmed patients mailing address.  [address], [place], [place] [id number]. Appointment packet mailed out. Patient instructed to arrive 20 minutes early  Transitional Care Questions  Questions for our staff to ask patients on Transitional care 48 hour phone call:   1. Are you/is patient experiencing any problems since coming home? Pain, tingling sensation Are there any questions regarding any aspect of care? No  2. Are there any questions regarding medications administration/dosing? No  Are meds being taken as prescribed? Yes  Patient should review meds with caller to confirm   3. Have there been any falls? No  4. Has Home Health been to the house and/or have they contacted you? No  If not, have you tried to contact them? No  Can we help you contact them? No   5. Are bowels and bladder emptying properly? Yes Are there any unexpected incontinence issues? No If applicable, is patient following bowel/bladder programs?  6. Any fevers, problems with breathing, unexpected pain? No   7. Are there any skin problems or new areas of breakdown? No wounds healing  8. Has the patient/family member arranged specialty MD follow up (ie cardiology/neurology/renal/surgical/etc)? No  Can we help arrange? No   9. Does the patient need any other services or support that we can help arrange? No  10. Are caregivers following through as expected in assisting the patient? Yes   11. Has the patient quit smoking, drinking alcohol, or using drugs as recommended? Patient stated that he does not do these things

## 2016-05-13 NOTE — Telephone Encounter (Signed)
1st attempt to contact patient for transitional care was unsuccessful. Left voice message for call back.  I also called patients mother and her phone was busy without opportunity to leave a message

## 2016-05-15 LAB — CULTURE, BLOOD (ROUTINE X 2): Culture: NO GROWTH

## 2016-05-17 LAB — BLOOD CULTURE ID PANEL (REFLEXED): Enterococcus species: INVALID

## 2016-05-25 ENCOUNTER — Emergency Department (HOSPITAL_COMMUNITY)
Admission: EM | Admit: 2016-05-25 | Discharge: 2016-05-25 | Disposition: A | Discharge status: Home / Self Care | Payer: Medicaid Other | Attending: Emergency Medicine | Admitting: Emergency Medicine

## 2016-05-25 ENCOUNTER — Telehealth: Payer: Self-pay | Admitting: Physical Medicine & Rehabilitation

## 2016-05-25 ENCOUNTER — Encounter (HOSPITAL_COMMUNITY): Payer: Self-pay

## 2016-05-25 DIAGNOSIS — G8929 Other chronic pain: Secondary | ICD-10-CM | POA: Diagnosis not present

## 2016-05-25 DIAGNOSIS — Z79899 Other long term (current) drug therapy: Secondary | ICD-10-CM | POA: Insufficient documentation

## 2016-05-25 DIAGNOSIS — M542 Cervicalgia: Secondary | ICD-10-CM | POA: Insufficient documentation

## 2016-05-25 MED ORDER — OXYCODONE HCL 10 MG PO TABS: 10.0000 mg | ORAL_TABLET | Freq: Four times a day (QID) | ORAL | 0 refills | Status: DC | PRN

## 2016-05-25 MED ORDER — OXYCODONE-ACETAMINOPHEN 5-325 MG PO TABS
2.0000 | ORAL_TABLET | Freq: Once | ORAL | Status: AC
  Administered 2016-05-25: 2 via ORAL
  Filled 2016-05-25: qty 2

## 2016-05-25 NOTE — Discharge Instructions (Signed)
To find a primary care or specialty doctor please call [phone] or [phone] to access "[facility] Find a Doctor Service." ° °You may also go on the [facility] website at [email or web address] ° °There are also multiple [facility] and [facility] practices throughout the [place] that are frequently accepting new patients. You may find a clinic that is close to your home and contact them. ° °[facility] -  °[address] °[place] [address] °[phone] ° °[facility] in [place] (also locations in [place] and [place]) -  °[address] °[place] [address] °[phone] ° °[facility] -  °[address] °[place] [address] °[phone] ° ° °

## 2016-05-25 NOTE — Telephone Encounter (Signed)
Notified James [name] we have a prescription for him to pick up and he needs to bring his license with him. This will be enough until appt with James [name].
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
## 2016-05-25 NOTE — Telephone Encounter (Signed)
rx for 10 written
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
## 2016-05-25 NOTE — Telephone Encounter (Signed)
Patient went to ED today and they have instructed him to call our office to get his oxycodone.  It looks like he has a TC visit this Thursday with Dr. Allena KatzPatel and I explained to patient that he needed to keep this visit.  Please call patient if he can get a prescription.

## 2016-05-27 ENCOUNTER — Encounter: Payer: Medicaid Other | Attending: Physical Medicine & Rehabilitation | Admitting: Physical Medicine & Rehabilitation

## 2016-05-27 ENCOUNTER — Encounter: Payer: Self-pay | Admitting: Physical Medicine & Rehabilitation

## 2016-05-27 VITALS — BP 116/75 | HR 96 | Resp 14

## 2016-05-27 DIAGNOSIS — S14124D Central cord syndrome at C4 level of cervical spinal cord, subsequent encounter: Secondary | ICD-10-CM | POA: Insufficient documentation

## 2016-05-27 DIAGNOSIS — Z5181 Encounter for therapeutic drug level monitoring: Secondary | ICD-10-CM | POA: Diagnosis not present

## 2016-05-27 DIAGNOSIS — W3400XA Accidental discharge from unspecified firearms or gun, initial encounter: Secondary | ICD-10-CM

## 2016-05-27 DIAGNOSIS — T148 Other injury of unspecified body region: Secondary | ICD-10-CM

## 2016-05-27 DIAGNOSIS — S14129S Central cord syndrome at unspecified level of cervical spinal cord, sequela: Secondary | ICD-10-CM | POA: Diagnosis not present

## 2016-05-27 DIAGNOSIS — Z79899 Other long term (current) drug therapy: Secondary | ICD-10-CM

## 2016-05-27 DIAGNOSIS — S14104S Unspecified injury at C4 level of cervical spinal cord, sequela: Secondary | ICD-10-CM

## 2016-05-27 DIAGNOSIS — S31109D Unspecified open wound of abdominal wall, unspecified quadrant without penetration into peritoneal cavity, subsequent encounter: Secondary | ICD-10-CM | POA: Insufficient documentation

## 2016-05-27 DIAGNOSIS — Z9889 Other specified postprocedural states: Secondary | ICD-10-CM

## 2016-05-27 DIAGNOSIS — M792 Neuralgia and neuritis, unspecified: Secondary | ICD-10-CM

## 2016-05-27 DIAGNOSIS — S12300D Unspecified displaced fracture of fourth cervical vertebra, subsequent encounter for fracture with routine healing: Secondary | ICD-10-CM | POA: Insufficient documentation

## 2016-05-27 DIAGNOSIS — G479 Sleep disorder, unspecified: Secondary | ICD-10-CM | POA: Insufficient documentation

## 2016-05-27 DIAGNOSIS — G629 Polyneuropathy, unspecified: Secondary | ICD-10-CM | POA: Insufficient documentation

## 2016-05-27 DIAGNOSIS — G894 Chronic pain syndrome: Secondary | ICD-10-CM | POA: Insufficient documentation

## 2016-05-27 DIAGNOSIS — S129XXS Fracture of neck, unspecified, sequela: Secondary | ICD-10-CM

## 2016-05-27 DIAGNOSIS — R200 Anesthesia of skin: Secondary | ICD-10-CM

## 2016-05-27 DIAGNOSIS — IMO0002 Reserved for concepts with insufficient information to code with codable children: Secondary | ICD-10-CM

## 2016-05-27 DIAGNOSIS — R202 Paresthesia of skin: Secondary | ICD-10-CM

## 2016-05-27 MED ORDER — TRAMADOL HCL 50 MG PO TABS: 100.0000 mg | ORAL_TABLET | Freq: Four times a day (QID) | ORAL | 0 refills | Status: DC | PRN

## 2016-05-27 MED ORDER — AMITRIPTYLINE HCL 25 MG PO TABS: 25.0000 mg | ORAL_TABLET | Freq: Every day | ORAL | 1 refills | Status: DC

## 2016-05-27 MED ORDER — PREGABALIN 150 MG PO CAPS
150.0000 mg | ORAL_CAPSULE | Freq: Three times a day (TID) | ORAL | 1 refills | Status: DC
Start: 2016-05-27 — End: 2016-07-13

## 2016-05-27 NOTE — Addendum Note (Signed)
Addended by: Doreene ElandSHUMAKER, Thien Berka W on: 05/27/2016 03:21 PM   Modules accepted: Orders
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
## 2016-05-27 NOTE — Patient Instructions (Signed)
Call Dr. Marikay Alaravid Jones for follow up appointment (Neurosurgery)  Maintain C-collar at all times  No driving.

## 2016-05-31 ENCOUNTER — Telehealth: Payer: Self-pay | Admitting: Physical Medicine & Rehabilitation

## 2016-05-31 NOTE — Telephone Encounter (Signed)
Patient is needing a letter that he is unable to work to send to social services.  Please call patient when letter is complete.

## 2016-05-31 NOTE — Telephone Encounter (Signed)
Letter written

## 2016-06-01 NOTE — Telephone Encounter (Signed)
James [name] called the patient and informed that the letter was available for pick up

## 2016-06-07 LAB — TOXASSURE SELECT,+ANTIDEPR,UR: PDF: 0

## 2016-06-10 NOTE — Progress Notes (Signed)
Urine drug screen for this encounter is consistent for prescribed medications.   

## 2016-06-15 ENCOUNTER — Telehealth: Payer: Self-pay | Admitting: Physical Medicine & Rehabilitation

## 2016-06-15 NOTE — Telephone Encounter (Signed)
Patient was seen initially by Dr. Allena KatzPatel with hospital follow up/TC. He has an upcoming appointment with Dr. Riley KillSwartz on 06/23/2016.  He was prescribed Oxycontin on 05/12/2016, oxycodone 10 mg on 05/25/2016 and given a script for tramadol was given by Dr. Allena KatzPatel.  Please refer to Dr. Eliane DecreePatel's note. Patient is asking for a refill on his oxycontin. Please advise

## 2016-06-15 NOTE — Telephone Encounter (Signed)
Patient is requesting a refill on oxycodone for pain.  Please call patient.

## 2016-06-16 MED ORDER — OXYCODONE HCL 10 MG PO TABS: 10.0000 mg | ORAL_TABLET | Freq: Four times a day (QID) | ORAL | 0 refills | Status: DC | PRN

## 2016-06-16 NOTE — Telephone Encounter (Signed)
i'll write him #30 oxy IR---oxycontin wasn't approved by insurance apparently

## 2016-06-16 NOTE — Telephone Encounter (Signed)
Pt made aware of rx. Rx signed and up front ready for pick up.

## 2016-06-23 ENCOUNTER — Telehealth: Payer: Self-pay | Admitting: Physical Medicine & Rehabilitation

## 2016-06-23 ENCOUNTER — Encounter: Payer: Self-pay | Admitting: Physical Medicine & Rehabilitation

## 2016-06-23 ENCOUNTER — Encounter: Payer: Medicaid Other | Attending: Physical Medicine & Rehabilitation | Admitting: Physical Medicine & Rehabilitation

## 2016-06-23 VITALS — BP 109/72 | HR 81 | Resp 14

## 2016-06-23 DIAGNOSIS — S12300D Unspecified displaced fracture of fourth cervical vertebra, subsequent encounter for fracture with routine healing: Secondary | ICD-10-CM | POA: Diagnosis not present

## 2016-06-23 DIAGNOSIS — R29898 Other symptoms and signs involving the musculoskeletal system: Secondary | ICD-10-CM

## 2016-06-23 DIAGNOSIS — G629 Polyneuropathy, unspecified: Secondary | ICD-10-CM | POA: Diagnosis not present

## 2016-06-23 DIAGNOSIS — G479 Sleep disorder, unspecified: Secondary | ICD-10-CM | POA: Diagnosis not present

## 2016-06-23 DIAGNOSIS — G894 Chronic pain syndrome: Secondary | ICD-10-CM | POA: Insufficient documentation

## 2016-06-23 DIAGNOSIS — S14129S Central cord syndrome at unspecified level of cervical spinal cord, sequela: Secondary | ICD-10-CM | POA: Diagnosis not present

## 2016-06-23 DIAGNOSIS — R203 Hyperesthesia: Secondary | ICD-10-CM

## 2016-06-23 DIAGNOSIS — S14104S Unspecified injury at C4 level of cervical spinal cord, sequela: Secondary | ICD-10-CM | POA: Diagnosis not present

## 2016-06-23 DIAGNOSIS — S14124D Central cord syndrome at C4 level of cervical spinal cord, subsequent encounter: Secondary | ICD-10-CM | POA: Insufficient documentation

## 2016-06-23 DIAGNOSIS — S31109D Unspecified open wound of abdominal wall, unspecified quadrant without penetration into peritoneal cavity, subsequent encounter: Secondary | ICD-10-CM | POA: Diagnosis not present

## 2016-06-23 DIAGNOSIS — Z9889 Other specified postprocedural states: Secondary | ICD-10-CM

## 2016-06-23 DIAGNOSIS — IMO0002 Reserved for concepts with insufficient information to code with codable children: Secondary | ICD-10-CM

## 2016-06-23 MED ORDER — DIVALPROEX SODIUM 250 MG PO DR TAB: 250.0000 mg | DELAYED_RELEASE_TABLET | Freq: Three times a day (TID) | ORAL | 3 refills | Status: DC

## 2016-06-23 MED ORDER — DIVALPROEX SODIUM 250 MG PO DR TAB: 250.0000 mg | DELAYED_RELEASE_TABLET | Freq: Two times a day (BID) | ORAL | 3 refills | Status: DC

## 2016-06-23 MED ORDER — OXYCODONE HCL 10 MG PO TABS: 10.0000 mg | ORAL_TABLET | Freq: Three times a day (TID) | ORAL | 0 refills | Status: DC | PRN

## 2016-06-23 NOTE — Patient Instructions (Signed)
WEAR YOUR COLLAR AT ALL TIMES UNTIL YOU SEE DR. DAVID Yetta BarreJONES!!  Address: [address], [place], [place] [address]  Hours:  Open today  9AM-5PM                       Phone: [phone]
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
## 2016-06-23 NOTE — Telephone Encounter (Signed)
Pharmacy is telling patient he needs approval for his oxycodone.  Please call patient when this is completed.

## 2016-06-23 NOTE — Telephone Encounter (Signed)
PA submitted for [place] TRACKS for oxycodone 10 mg

## 2016-06-28 ENCOUNTER — Telehealth: Payer: Self-pay | Admitting: Physical Medicine & Rehabilitation

## 2016-06-28 NOTE — Telephone Encounter (Signed)
Patient is calling back about his prior authorization for oxycodone.

## 2016-06-28 NOTE — Telephone Encounter (Signed)
I have resub[facility] as the one on 06/25/16 was denied. I have sent more information as to why he filled RX for oxycontin (5 day) and oxycodone (8day) from hospital discharge and has new rx for oxycodone given 06/23/16.  Hopefully this information with [facility] report showing all RX from this office /hosp will satisfy their information needs.

## 2016-07-01 ENCOUNTER — Telehealth: Payer: Self-pay | Admitting: *Deleted

## 2016-07-01 NOTE — Telephone Encounter (Signed)
[place] TRACKS PA for oxycodone 10 mg approved for 6 months 06/29/16-12/26/16. Micha notified.

## 2016-07-07 ENCOUNTER — Encounter (HOSPITAL_COMMUNITY): Payer: Self-pay

## 2016-07-07 ENCOUNTER — Ambulatory Visit: Payer: Medicaid Other | Attending: Physical Medicine & Rehabilitation | Admitting: Occupational Therapy

## 2016-07-07 ENCOUNTER — Encounter (HOSPITAL_COMMUNITY)
Admission: RE | Admit: 2016-07-07 | Discharge: 2016-07-07 | Disposition: A | Discharge status: Home / Self Care | Payer: Medicaid Other | Source: Ambulatory Visit | Attending: General Surgery | Admitting: General Surgery

## 2016-07-07 DIAGNOSIS — Z01812 Encounter for preprocedural laboratory examination: Secondary | ICD-10-CM | POA: Diagnosis not present

## 2016-07-07 HISTORY — DX: Adverse effect of unspecified anesthetic, initial encounter: T41.45XA

## 2016-07-07 HISTORY — DX: Cervicalgia: M54.2

## 2016-07-07 HISTORY — DX: Other chronic pain: G89.29

## 2016-07-07 HISTORY — DX: Other complications of anesthesia, initial encounter: T88.59XA

## 2016-07-07 HISTORY — DX: Insomnia, unspecified: G47.00

## 2016-07-07 LAB — BASIC METABOLIC PANEL
ANION GAP: 8 (ref 5–15)
BUN: 8 mg/dL (ref 6–20)
CHLORIDE: 104 mmol/L (ref 101–111)
CO2: 27 mmol/L (ref 22–32)
Calcium: 9.2 mg/dL (ref 8.9–10.3)
Creatinine, Ser: 0.98 mg/dL (ref 0.61–1.24)
Glucose, Bld: 97 mg/dL (ref 65–99)
Potassium: 4.2 mmol/L (ref 3.5–5.1)
Sodium: 139 mmol/L (ref 135–145)

## 2016-07-07 LAB — CBC
HEMATOCRIT: 42.6 % (ref 39.0–52.0)
HEMOGLOBIN: 14 g/dL (ref 13.0–17.0)
MCH: 28.3 pg (ref 26.0–34.0)
MCHC: 32.9 g/dL (ref 30.0–36.0)
MCV: 86.1 fL (ref 78.0–100.0)
Platelets: 231 10*3/uL (ref 150–400)
RBC: 4.95 MIL/uL (ref 4.22–5.81)
RDW: 13.8 % (ref 11.5–15.5)
WBC: 7.8 10*3/uL (ref 4.0–10.5)

## 2016-07-07 MED ORDER — CHLORHEXIDINE GLUCONATE CLOTH 2 % EX PADS: 6.0000 | MEDICATED_PAD | Freq: Once | CUTANEOUS | Status: DC

## 2016-07-07 NOTE — Pre-Procedure Instructions (Signed)
James [name]  07/07/2016      [facility] - [place], [place] - [address]  [place] [id number] Phone: [phone] Fax: [phone]    Your procedure is scheduled on Thurs, Oct 12 @ 8:00 AM  [facility] Admitting at 6:00 AM  Call this number if you have problems the morning of surgery:  [phone]   Remember:  Do not eat food or drink liquids after midnight.  Take these medicines the morning of surgery with A SIP OF WATER Depakote(Divalproex),Pain Pill(if needed),and Lyrica(Pregabalin)              Stop taking your Naprosyn. No Goody's,BC's,Aleve,Advil,Motrin,Fish Oil,or any Herbal Medications.    Do not wear jewelry.  Do not wear lotions, powders,colognes, or deoderant.             Men may shave face and neck.  Do not bring valuables to the hospital.  [facility] is not responsible for any belongings or valuables.  Contacts, dentures or bridgework may not be worn into surgery.  Leave your suitcase in the car.  After surgery it may be brought to your room.  For patients admitted to the hospital, discharge time will be determined by your treatment team.  Patients discharged the day of surgery will not be allowed to drive home.    [facility]  Before surgery, you can play an important role.  Because skin is not sterile, your skin needs to be as free of germs as possible.  You can reduce the number of germs on you skin by washing with CHG (chlorahexidine gluconate) soap before surgery.  CHG is an antiseptic cleaner which kills germs and bonds with the skin to continue killing germs even after washing.  Please DO NOT use if you have an allergy to CHG or antibacterial soaps.  If your skin becomes reddened/irritated stop using the CHG and inform your nurse when you arrive at Short Stay.  Do not shave (including legs and underarms) for at least 48 hours  prior to the first CHG shower.  You may shave your face.  Please follow these instructions carefully:   1.  Shower with CHG Soap the night before surgery and the                                morning of Surgery.  2.  If you choose to wash your hair, wash your hair first as usual with your       normal shampoo.  3.  After you shampoo, rinse your hair and body thoroughly to remove the                      Shampoo.  4.  Use CHG as you would any other liquid soap.  You can apply chg directly       to the skin and wash gently with scrungie or a clean washcloth.  5.  Apply the CHG Soap to your body ONLY FROM THE NECK DOWN.        Do not use on open wounds or open sores.  Avoid contact with your eyes,       ears, mouth and genitals (private parts).  Wash genitals (private parts)       with your normal soap.  6.  Wash thoroughly, paying special attention  to the area where your surgery        will be performed.  7.  Thoroughly rinse your body with warm water from the neck down.  8.  DO NOT shower/wash with your normal soap after using and rinsing off       the CHG Soap.  9.  Pat yourself dry with a clean towel.            10.  Wear clean pajamas.            11.  Place clean sheets on your bed the night of your first shower and do not        sleep with pets.  Day of Surgery  Do not apply any lotions/deoderants the morning of surgery.  Please wear clean clothes to the hospital/surgery center.  ns:    Please read over the following fact sheets that you were given. Pain Booklet and Surgical Site Infection Prevention

## 2016-07-07 NOTE — Progress Notes (Signed)
Pt didn't know anything about a bowel prep. I reviewed bowel prep instructions with him. I called and spoke with Crystal,nurse for Dr.Thompson to let her know. [facility] [address].Pt verbalized understanding of what he needs to do

## 2016-07-07 NOTE — Progress Notes (Addendum)
Cardiologist denies  Medical Md doesn't have one  Echo denies   Stress test denies  Heart cath denies   EKG denies  CXR in epic from 05-12-16

## 2016-07-13 ENCOUNTER — Encounter: Payer: Medicaid Other | Admitting: Physical Medicine & Rehabilitation

## 2016-07-13 ENCOUNTER — Encounter: Payer: Medicaid Other | Attending: Physical Medicine & Rehabilitation | Admitting: Physical Medicine & Rehabilitation

## 2016-07-13 ENCOUNTER — Encounter: Payer: Self-pay | Admitting: Physical Medicine & Rehabilitation

## 2016-07-13 VITALS — BP 124/75 | HR 85 | Resp 14

## 2016-07-13 DIAGNOSIS — G479 Sleep disorder, unspecified: Secondary | ICD-10-CM | POA: Diagnosis not present

## 2016-07-13 DIAGNOSIS — R29898 Other symptoms and signs involving the musculoskeletal system: Secondary | ICD-10-CM

## 2016-07-13 DIAGNOSIS — S14124D Central cord syndrome at C4 level of cervical spinal cord, subsequent encounter: Secondary | ICD-10-CM | POA: Insufficient documentation

## 2016-07-13 DIAGNOSIS — S31109D Unspecified open wound of abdominal wall, unspecified quadrant without penetration into peritoneal cavity, subsequent encounter: Secondary | ICD-10-CM | POA: Insufficient documentation

## 2016-07-13 DIAGNOSIS — S12300D Unspecified displaced fracture of fourth cervical vertebra, subsequent encounter for fracture with routine healing: Secondary | ICD-10-CM | POA: Diagnosis not present

## 2016-07-13 DIAGNOSIS — M792 Neuralgia and neuritis, unspecified: Secondary | ICD-10-CM

## 2016-07-13 DIAGNOSIS — IMO0002 Reserved for concepts with insufficient information to code with codable children: Secondary | ICD-10-CM

## 2016-07-13 DIAGNOSIS — S14129S Central cord syndrome at unspecified level of cervical spinal cord, sequela: Secondary | ICD-10-CM | POA: Diagnosis not present

## 2016-07-13 DIAGNOSIS — R203 Hyperesthesia: Secondary | ICD-10-CM

## 2016-07-13 DIAGNOSIS — G894 Chronic pain syndrome: Secondary | ICD-10-CM | POA: Diagnosis present

## 2016-07-13 DIAGNOSIS — S14104S Unspecified injury at C4 level of cervical spinal cord, sequela: Secondary | ICD-10-CM | POA: Diagnosis not present

## 2016-07-13 DIAGNOSIS — G629 Polyneuropathy, unspecified: Secondary | ICD-10-CM | POA: Diagnosis not present

## 2016-07-13 DIAGNOSIS — Z9889 Other specified postprocedural states: Secondary | ICD-10-CM

## 2016-07-13 MED ORDER — OXYCODONE HCL 10 MG PO TABS: 10.0000 mg | ORAL_TABLET | Freq: Three times a day (TID) | ORAL | 0 refills | Status: DC | PRN

## 2016-07-13 MED ORDER — DIVALPROEX SODIUM 500 MG PO DR TAB: 500.0000 mg | DELAYED_RELEASE_TABLET | Freq: Two times a day (BID) | ORAL | 4 refills | Status: DC

## 2016-07-13 MED ORDER — TRAMADOL HCL 50 MG PO TABS: 100.0000 mg | ORAL_TABLET | Freq: Four times a day (QID) | ORAL | 0 refills | Status: DC | PRN

## 2016-07-13 MED ORDER — PREGABALIN 150 MG PO CAPS: 300.0000 mg | ORAL_CAPSULE | Freq: Two times a day (BID) | ORAL | 2 refills | Status: DC

## 2016-07-13 NOTE — Patient Instructions (Signed)
Start with the increased lyrica first----(4 days) then you can increase the depakote    MAKE AN APPOINTMENT WITH DR. Yetta BarreJONES!!!!!  PLEASE CALL ME WITH ANY PROBLEMS OR QUESTIONS [phone])
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
## 2016-07-15 ENCOUNTER — Encounter (HOSPITAL_COMMUNITY): Payer: Self-pay | Admitting: *Deleted

## 2016-07-15 ENCOUNTER — Inpatient Hospital Stay (HOSPITAL_COMMUNITY)
Admission: RE | Admit: 2016-07-15 | Discharge: 2016-07-21 | DRG: 331 | Disposition: A | Discharge status: Home / Self Care | Payer: Medicaid Other | Source: Ambulatory Visit | Attending: General Surgery | Admitting: General Surgery

## 2016-07-15 ENCOUNTER — Ambulatory Visit: Payer: Medicaid Other | Admitting: Registered Nurse

## 2016-07-15 ENCOUNTER — Inpatient Hospital Stay (HOSPITAL_COMMUNITY): Payer: Medicaid Other | Admitting: Anesthesiology

## 2016-07-15 ENCOUNTER — Encounter (HOSPITAL_COMMUNITY): Admission: RE | Disposition: A | Discharge status: Home / Self Care | Payer: Self-pay | Source: Ambulatory Visit

## 2016-07-15 DIAGNOSIS — Z433 Encounter for attention to colostomy: Principal | ICD-10-CM

## 2016-07-15 DIAGNOSIS — K660 Peritoneal adhesions (postprocedural) (postinfection): Secondary | ICD-10-CM | POA: Diagnosis present

## 2016-07-15 DIAGNOSIS — Z9889 Other specified postprocedural states: Secondary | ICD-10-CM

## 2016-07-15 HISTORY — DX: Unspecified injury at C4 level of cervical spinal cord, initial encounter: S14.104A

## 2016-07-15 HISTORY — PX: COLOSTOMY TAKEDOWN: SHX5783

## 2016-07-15 SURGERY — CLOSURE, COLOSTOMY
Anesthesia: General | Site: Abdomen

## 2016-07-15 MED ORDER — HYDROMORPHONE HCL 1 MG/ML IJ SOLN
INTRAMUSCULAR | Status: AC
  Filled 2016-07-15: qty 1

## 2016-07-15 MED ORDER — DIPHENHYDRAMINE HCL 12.5 MG/5ML PO ELIX: 12.5000 mg | ORAL_SOLUTION | Freq: Four times a day (QID) | ORAL | Status: DC | PRN

## 2016-07-15 MED ORDER — HYDROMORPHONE 1 MG/ML IV SOLN
INTRAVENOUS | Status: DC
  Administered 2016-07-15: 4.2 mg via INTRAVENOUS
  Administered 2016-07-15: 11:00:00 via INTRAVENOUS
  Administered 2016-07-15: 0 mg via INTRAVENOUS
  Administered 2016-07-15: 4.2 mg via INTRAVENOUS
  Administered 2016-07-16: 1.7 mg via INTRAVENOUS
  Administered 2016-07-16: 1.8 mg via INTRAVENOUS
  Administered 2016-07-16: 1 mg via INTRAVENOUS
  Administered 2016-07-16: 6 mg via INTRAVENOUS
  Administered 2016-07-16: 0.6 mg via INTRAVENOUS
  Administered 2016-07-17: 0.3 mg via INTRAVENOUS
  Administered 2016-07-17: 16:00:00 via INTRAVENOUS
  Administered 2016-07-17: 0.9 mg via INTRAVENOUS
  Administered 2016-07-17: 1.5 mg via INTRAVENOUS
  Administered 2016-07-17: 3 mg via INTRAVENOUS
  Administered 2016-07-17: 2.4 mg via INTRAVENOUS
  Administered 2016-07-17: 0.9 mg via INTRAVENOUS
  Administered 2016-07-17: 0.3 mg via INTRAVENOUS
  Administered 2016-07-18: 0.9 mg via INTRAVENOUS
  Administered 2016-07-18: 1.2 mg via INTRAVENOUS
  Administered 2016-07-18: 0.6 mg via INTRAVENOUS
  Administered 2016-07-18: 1.5 mg via INTRAVENOUS
  Administered 2016-07-18: 1.2 mg via INTRAVENOUS
  Administered 2016-07-19: 2.1 mg via INTRAVENOUS
  Administered 2016-07-19: 1.5 mg via INTRAVENOUS
  Filled 2016-07-15: qty 25

## 2016-07-15 MED ORDER — ONDANSETRON HCL 4 MG/2ML IJ SOLN
INTRAMUSCULAR | Status: DC | PRN
  Administered 2016-07-15: 4 mg via INTRAVENOUS

## 2016-07-15 MED ORDER — PHENYLEPHRINE 40 MCG/ML (10ML) SYRINGE FOR IV PUSH (FOR BLOOD PRESSURE SUPPORT)
PREFILLED_SYRINGE | INTRAVENOUS | Status: AC
  Filled 2016-07-15: qty 20

## 2016-07-15 MED ORDER — PROMETHAZINE HCL 25 MG/ML IJ SOLN: 6.2500 mg | INTRAMUSCULAR | Status: DC | PRN

## 2016-07-15 MED ORDER — ONDANSETRON HCL 4 MG/2ML IJ SOLN: 4.0000 mg | Freq: Four times a day (QID) | INTRAMUSCULAR | Status: DC | PRN

## 2016-07-15 MED ORDER — ACETAMINOPHEN 325 MG PO TABS
325.0000 mg | ORAL_TABLET | Freq: Four times a day (QID) | ORAL | Status: DC | PRN
  Administered 2016-07-16 (×2): 650 mg via ORAL
  Filled 2016-07-15 (×2): qty 2

## 2016-07-15 MED ORDER — ALVIMOPAN 12 MG PO CAPS
ORAL_CAPSULE | ORAL | Status: AC
  Administered 2016-07-15: 12 mg via ORAL
  Filled 2016-07-15: qty 1

## 2016-07-15 MED ORDER — GLYCOPYRROLATE 0.2 MG/ML IV SOSY
PREFILLED_SYRINGE | INTRAVENOUS | Status: AC
  Filled 2016-07-15: qty 3

## 2016-07-15 MED ORDER — FENTANYL CITRATE (PF) 100 MCG/2ML IJ SOLN
INTRAMUSCULAR | Status: AC
  Filled 2016-07-15: qty 4

## 2016-07-15 MED ORDER — PROPOFOL 10 MG/ML IV BOLUS
INTRAVENOUS | Status: AC
  Filled 2016-07-15: qty 40

## 2016-07-15 MED ORDER — MIDAZOLAM HCL 5 MG/5ML IJ SOLN
INTRAMUSCULAR | Status: DC | PRN
  Administered 2016-07-15: 2 mg via INTRAVENOUS

## 2016-07-15 MED ORDER — PREGABALIN 75 MG PO CAPS
300.0000 mg | ORAL_CAPSULE | Freq: Two times a day (BID) | ORAL | Status: DC
  Administered 2016-07-15 – 2016-07-21 (×12): 300 mg via ORAL
  Filled 2016-07-15 (×12): qty 4

## 2016-07-15 MED ORDER — SUGAMMADEX SODIUM 200 MG/2ML IV SOLN
INTRAVENOUS | Status: DC | PRN
  Administered 2016-07-15: 200 mg via INTRAVENOUS

## 2016-07-15 MED ORDER — MIDAZOLAM HCL 2 MG/2ML IJ SOLN
INTRAMUSCULAR | Status: AC
  Filled 2016-07-15: qty 2

## 2016-07-15 MED ORDER — ONDANSETRON HCL 4 MG/2ML IJ SOLN
INTRAMUSCULAR | Status: AC
  Filled 2016-07-15: qty 2

## 2016-07-15 MED ORDER — SODIUM CHLORIDE 0.9% FLUSH: 9.0000 mL | INTRAVENOUS | Status: DC | PRN

## 2016-07-15 MED ORDER — CEFOTETAN DISODIUM-DEXTROSE 2-2.08 GM-% IV SOLR: 2.0000 g | Freq: Once | INTRAVENOUS | Status: DC

## 2016-07-15 MED ORDER — FENTANYL CITRATE (PF) 100 MCG/2ML IJ SOLN
INTRAMUSCULAR | Status: DC | PRN
Start: 2016-07-15 — End: 2016-07-15
  Administered 2016-07-15: 50 ug via INTRAVENOUS
  Administered 2016-07-15: 150 ug via INTRAVENOUS
  Administered 2016-07-15: 100 ug via INTRAVENOUS

## 2016-07-15 MED ORDER — LIDOCAINE 2% (20 MG/ML) 5 ML SYRINGE
INTRAMUSCULAR | Status: AC
  Filled 2016-07-15: qty 5

## 2016-07-15 MED ORDER — KCL IN DEXTROSE-NACL 20-5-0.45 MEQ/L-%-% IV SOLN
INTRAVENOUS | Status: DC
  Administered 2016-07-15 – 2016-07-19 (×10): via INTRAVENOUS
  Filled 2016-07-15 (×11): qty 1000

## 2016-07-15 MED ORDER — DIPHENHYDRAMINE HCL 50 MG/ML IJ SOLN
12.5000 mg | Freq: Four times a day (QID) | INTRAMUSCULAR | Status: DC | PRN
Start: 2016-07-15 — End: 2016-07-21

## 2016-07-15 MED ORDER — SUGAMMADEX SODIUM 200 MG/2ML IV SOLN
INTRAVENOUS | Status: AC
Start: 2016-07-15 — End: 2016-07-15
  Filled 2016-07-15: qty 2

## 2016-07-15 MED ORDER — PHENYLEPHRINE 40 MCG/ML (10ML) SYRINGE FOR IV PUSH (FOR BLOOD PRESSURE SUPPORT)
PREFILLED_SYRINGE | INTRAVENOUS | Status: DC | PRN
  Administered 2016-07-15: 40 ug via INTRAVENOUS

## 2016-07-15 MED ORDER — NALOXONE HCL 0.4 MG/ML IJ SOLN: 0.4000 mg | INTRAMUSCULAR | Status: DC | PRN

## 2016-07-15 MED ORDER — HYDROMORPHONE 1 MG/ML IV SOLN
INTRAVENOUS | Status: AC
  Filled 2016-07-15: qty 25

## 2016-07-15 MED ORDER — CEFOTETAN DISODIUM-DEXTROSE 2-2.08 GM-% IV SOLR
INTRAVENOUS | Status: AC
  Filled 2016-07-15: qty 50

## 2016-07-15 MED ORDER — NEOSTIGMINE METHYLSULFATE 5 MG/5ML IV SOSY
PREFILLED_SYRINGE | INTRAVENOUS | Status: AC
  Filled 2016-07-15: qty 5

## 2016-07-15 MED ORDER — MEPERIDINE HCL 25 MG/ML IJ SOLN: 6.2500 mg | INTRAMUSCULAR | Status: DC | PRN

## 2016-07-15 MED ORDER — ROCURONIUM BROMIDE 10 MG/ML (PF) SYRINGE
PREFILLED_SYRINGE | INTRAVENOUS | Status: AC
  Filled 2016-07-15: qty 10

## 2016-07-15 MED ORDER — ALVIMOPAN 12 MG PO CAPS
12.0000 mg | ORAL_CAPSULE | Freq: Two times a day (BID) | ORAL | Status: DC
  Administered 2016-07-16 – 2016-07-19 (×8): 12 mg via ORAL
  Filled 2016-07-15 (×9): qty 1

## 2016-07-15 MED ORDER — FENTANYL CITRATE (PF) 100 MCG/2ML IJ SOLN
INTRAMUSCULAR | Status: AC
  Filled 2016-07-15: qty 2

## 2016-07-15 MED ORDER — DIVALPROEX SODIUM 500 MG PO DR TAB
500.0000 mg | DELAYED_RELEASE_TABLET | Freq: Two times a day (BID) | ORAL | Status: DC
  Administered 2016-07-15 – 2016-07-21 (×12): 500 mg via ORAL
  Filled 2016-07-15 (×14): qty 1

## 2016-07-15 MED ORDER — AMITRIPTYLINE HCL 25 MG PO TABS
25.0000 mg | ORAL_TABLET | Freq: Every day | ORAL | Status: DC
  Administered 2016-07-15 – 2016-07-20 (×6): 25 mg via ORAL
  Filled 2016-07-15 (×6): qty 1

## 2016-07-15 MED ORDER — 0.9 % SODIUM CHLORIDE (POUR BTL) OPTIME
TOPICAL | Status: DC | PRN
  Administered 2016-07-15 (×3): 1000 mL

## 2016-07-15 MED ORDER — ROCURONIUM BROMIDE 10 MG/ML (PF) SYRINGE
PREFILLED_SYRINGE | INTRAVENOUS | Status: DC | PRN
  Administered 2016-07-15: 40 mg via INTRAVENOUS
  Administered 2016-07-15: 60 mg via INTRAVENOUS

## 2016-07-15 MED ORDER — ALVIMOPAN 12 MG PO CAPS
12.0000 mg | ORAL_CAPSULE | Freq: Once | ORAL | Status: AC
  Administered 2016-07-15: 12 mg via ORAL

## 2016-07-15 MED ORDER — HYDROMORPHONE HCL 1 MG/ML IJ SOLN: 0.2500 mg | INTRAMUSCULAR | Status: DC | PRN

## 2016-07-15 MED ORDER — ALVIMOPAN 12 MG PO CAPS: 12.0000 mg | ORAL_CAPSULE | Freq: Once | ORAL | Status: DC

## 2016-07-15 MED ORDER — LACTATED RINGERS IV SOLN
INTRAVENOUS | Status: DC | PRN
  Administered 2016-07-15 (×3): via INTRAVENOUS

## 2016-07-15 MED ORDER — PROPOFOL 10 MG/ML IV BOLUS
INTRAVENOUS | Status: DC | PRN
  Administered 2016-07-15: 200 mg via INTRAVENOUS

## 2016-07-15 MED ORDER — LIDOCAINE 2% (20 MG/ML) 5 ML SYRINGE
INTRAMUSCULAR | Status: DC | PRN
  Administered 2016-07-15: 100 mg via INTRAVENOUS

## 2016-07-15 MED ORDER — CEFOTETAN DISODIUM-DEXTROSE 2-2.08 GM-% IV SOLR
2.0000 g | INTRAVENOUS | Status: AC
  Administered 2016-07-15: 2 g via INTRAVENOUS

## 2016-07-15 MED ORDER — HYDRALAZINE HCL 20 MG/ML IJ SOLN: 10.0000 mg | INTRAMUSCULAR | Status: DC | PRN

## 2016-07-15 MED ORDER — ENOXAPARIN SODIUM 40 MG/0.4ML ~~LOC~~ SOLN
40.0000 mg | SUBCUTANEOUS | Status: DC
  Administered 2016-07-17: 40 mg via SUBCUTANEOUS
  Filled 2016-07-15 (×5): qty 0.4

## 2016-07-15 SURGICAL SUPPLY — 62 items
CANISTER SUCTION 2500CC (MISCELLANEOUS) ×3 IMPLANT
CHLORAPREP W/TINT 26ML (MISCELLANEOUS) ×3 IMPLANT
COVER MAYO STAND STRL (DRAPES) ×5 IMPLANT
COVER SURGICAL LIGHT HANDLE (MISCELLANEOUS) ×6 IMPLANT
DRAPE LAPAROSCOPIC ABDOMINAL (DRAPES) ×3 IMPLANT
DRAPE PROXIMA HALF (DRAPES) ×4 IMPLANT
DRAPE UTILITY XL STRL (DRAPES) ×7 IMPLANT
DRAPE WARM FLUID 44X44 (DRAPE) ×3 IMPLANT
DRSG OPSITE POSTOP 4X10 (GAUZE/BANDAGES/DRESSINGS) ×2 IMPLANT
DRSG OPSITE POSTOP 4X8 (GAUZE/BANDAGES/DRESSINGS) IMPLANT
ELECT BLADE 6.5 EXT (BLADE) ×3 IMPLANT
ELECT CAUTERY BLADE 6.4 (BLADE) ×6 IMPLANT
ELECT REM PT RETURN 9FT ADLT (ELECTROSURGICAL) ×3
ELECTRODE REM PT RTRN 9FT ADLT (ELECTROSURGICAL) ×1 IMPLANT
GAUZE SPONGE 4X4 12PLY STRL (GAUZE/BANDAGES/DRESSINGS) ×2 IMPLANT
GLOVE BIO SURGEON STRL SZ 6.5 (GLOVE) ×1 IMPLANT
GLOVE BIO SURGEON STRL SZ8 (GLOVE) ×6 IMPLANT
GLOVE BIO SURGEONS STRL SZ 6.5 (GLOVE) ×1
GLOVE BIOGEL PI IND STRL 6.5 (GLOVE) IMPLANT
GLOVE BIOGEL PI IND STRL 7.0 (GLOVE) IMPLANT
GLOVE BIOGEL PI IND STRL 8 (GLOVE) ×2 IMPLANT
GLOVE BIOGEL PI INDICATOR 6.5 (GLOVE) ×2
GLOVE BIOGEL PI INDICATOR 7.0 (GLOVE) ×4
GLOVE BIOGEL PI INDICATOR 8 (GLOVE) ×8
GLOVE ECLIPSE 8.0 STRL XLNG CF (GLOVE) ×4 IMPLANT
GLOVE SURG SS PI 6.5 STRL IVOR (GLOVE) ×4 IMPLANT
GLOVE SURG SS PI 7.0 STRL IVOR (GLOVE) ×4 IMPLANT
GOWN STRL REUS W/ TWL LRG LVL3 (GOWN DISPOSABLE) ×4 IMPLANT
GOWN STRL REUS W/ TWL XL LVL3 (GOWN DISPOSABLE) ×2 IMPLANT
GOWN STRL REUS W/TWL LRG LVL3 (GOWN DISPOSABLE) ×12
GOWN STRL REUS W/TWL XL LVL3 (GOWN DISPOSABLE) ×12
KIT BASIN OR (CUSTOM PROCEDURE TRAY) ×3 IMPLANT
KIT ROOM TURNOVER OR (KITS) ×3 IMPLANT
LEGGING LITHOTOMY PAIR STRL (DRAPES) ×3 IMPLANT
LIGASURE IMPACT 36 18CM CVD LR (INSTRUMENTS) IMPLANT
NS IRRIG 1000ML POUR BTL (IV SOLUTION) ×6 IMPLANT
PACK GENERAL/GYN (CUSTOM PROCEDURE TRAY) ×3 IMPLANT
PAD ARMBOARD 7.5X6 YLW CONV (MISCELLANEOUS) ×3 IMPLANT
PENCIL BUTTON HOLSTER BLD 10FT (ELECTRODE) ×3 IMPLANT
SPECIMEN JAR MEDIUM (MISCELLANEOUS) IMPLANT
SPONGE LAP 18X18 X RAY DECT (DISPOSABLE) ×2 IMPLANT
STAPLER CIRC CVD 29MM 37CM (STAPLE) ×2 IMPLANT
STAPLER VISISTAT 35W (STAPLE) ×3 IMPLANT
SUCTION POOLE TIP (SUCTIONS) ×3 IMPLANT
SURGILUBE 2OZ TUBE FLIPTOP (MISCELLANEOUS) IMPLANT
SUT PDS AB 1 CTX 36 (SUTURE) ×4 IMPLANT
SUT PDS AB 1 TP1 96 (SUTURE) ×6 IMPLANT
SUT PROLENE 2 0 CT2 30 (SUTURE) IMPLANT
SUT PROLENE 2 0 KS (SUTURE) IMPLANT
SUT SILK 2 0 SH CR/8 (SUTURE) ×3 IMPLANT
SUT SILK 2 0 TIES 10X30 (SUTURE) ×3 IMPLANT
SUT SILK 3 0 SH CR/8 (SUTURE) ×3 IMPLANT
SUT SILK 3 0 TIES 10X30 (SUTURE) ×3 IMPLANT
SYR BULB IRRIGATION 50ML (SYRINGE) ×3 IMPLANT
TAPE CLOTH SURG 6X10 WHT LF (GAUZE/BANDAGES/DRESSINGS) ×2 IMPLANT
TOWEL OR 17X26 10 PK STRL BLUE (TOWEL DISPOSABLE) ×6 IMPLANT
TRAY FOLEY CATH 16FRSI W/METER (SET/KITS/TRAYS/PACK) ×3 IMPLANT
TRAY PROCTOSCOPIC FIBER OPTIC (SET/KITS/TRAYS/PACK) ×2 IMPLANT
TUBE CONNECTING 12'X1/4 (SUCTIONS) ×1
TUBE CONNECTING 12X1/4 (SUCTIONS) ×2 IMPLANT
UNDERPAD 30X30 (UNDERPADS AND DIAPERS) IMPLANT
YANKAUER SUCT BULB TIP NO VENT (SUCTIONS) ×6 IMPLANT

## 2016-07-15 NOTE — Transfer of Care (Signed)
Immediate Anesthesia Transfer of Care Note  Patient: James [name]  Procedure(s) Performed: Procedure(s): COLOSTOMY TAKEDOWN (N/A)  Patient Location: PACU  Anesthesia Type:General  Level of Consciousness: awake, patient cooperative and responds to stimulation  Airway & Oxygen Therapy: Patient Spontanous Breathing and Patient connected to nasal cannula oxygen  Post-op Assessment: Report given to RN and Post -op Vital signs reviewed and stable  Post vital signs: Reviewed and stable  Last Vitals:  Vitals:   07/15/16 1030 07/15/16 1041  BP: (!) 137/54   Pulse: (!) 109   Resp: (!) 28 16  Temp:      Last Pain:  Vitals:   07/15/16 1041  TempSrc:   PainSc: 0-No pain         Complications: No apparent anesthesia complications

## 2016-07-15 NOTE — Op Note (Signed)
07/15/2016  10:14 AM  PATIENT:  James [name]  20 y.o. male  PRE-OPERATIVE DIAGNOSIS:  Colostomy  POST-OPERATIVE DIAGNOSIS:  Colostomy   PROCEDURE:  Procedure(s): COLOSTOMY TAKEDOWN LYSIS OF ADHESIONS 30MIN  SURGEON:  Violeta GelinasBurke Shadai Mcclane, MD  ASSISTANTS: Estelle GrumblesSteve Gross, MD; Octavia Heiraroline Gamble, PAS   ANESTHESIA:   general  EBL:  Total I/O In: 2000 [I.V.:2000] Out: 375 [Urine:225; Other:100; Blood:50]  BLOOD ADMINISTERED:none  DRAINS: none   SPECIMEN:  Excision  DISPOSITION OF SPECIMEN:  PATHOLOGY  COUNTS:  YES  DICTATION: .Dragon Dictation Von presents for colostomy takedown. He was identified in the preop holding area. Informed consent was obtained. He received intravenous antibiotics. He was brought to the operating room and general endotracheal anesthesia was administered by the anesthesia staff. Time out procedure was performed after placing him in lithotomy position. His colostomy was closed with 2-0 silk. His abdomen, perineum, and perianal area were prepped and draped in sterile fashion. Midline incision was made in his old scar was excised. The fascia was identified and divided along the midline and the. Cavity was entered carefully using sharp dissection under direct vision. There was some omental adhesions present which were swept away. The fascia was gradually opened along the midline as we were able to free up the adhesions. Next, adhesiolysis was performed for approximately 30 minutes. This freed up the omental adhesions more superiorly and there were a lot of small bowel adhesions inferiorly and down into the pelvis. There were no enterotomies. Once we completed this, we identified the rectosigmoid stump. This was freed up from surrounding adhesions. It was mobilized slightly. Once that was completed, attention was directed to the colostomy site. Elliptical incision was made at the skin and the colostomy was circumferentially dissected from the subcutaneous tissues and muscle  and brought back into the abdomen. Further mobilization was done of the descending colon. Once this was completed the colon was divided a few centimeters proximal to the stoma and the colostomy was sent to pathology. The mesentery was divided between [place] clamps and tied securely. There was good hemostasis. Sizers were then used and we chose a 29. A pursestring of Prolene was placed at the end of the proximal colon. The anvil was placed in the Prolene was tied securely. Some fatty tissue was debrided from the end making a nice surface for the staple line. Next, I went below and placed the small sizer up from his rectum with good lubrication. Next the EEA stapler was inserted and was positioned appropriately and the anastomosis was performed. There was no tension and this went well. Both donuts were complete. Rigid sigmoidoscopy was then done and there was no significant bleeding. The colon was insufflated with air and there was no bubbles of air when checked in the abdomen under irrigation fluid. The anastomosis was pink and viable. The abdomen was irrigated and hemostasis was ensured. We completely changed our gowns and gloves using the: Protocol. Instruments were changed and there were new drapes placed. Anastomosis was rechecked and appeared viable, intact, and under no tension. Next, the fascial defect at the ostomy site was closed from the inside with running #1 PDS. It was then closed from the outside in a transverse fashion with running #1 PDS. Midline fascia was closed with #1 PDS from each end and tied in the middle. Subcutaneous tissues were irrigated and the skin was closed with staples. Sterile dressing was applied. All counts were correct. He tolerated the procedure well without apparent complication was taken recovery in  stable condition. PATIENT DISPOSITION:  PACU - hemodynamically stable.   Delay start of Pharmacological VTE agent (>24hrs) due to surgical blood loss or risk of bleeding:   no  Violeta Gelinas, MD, MPH, FACS Pager: [phone]  10/12/201710:14 AM

## 2016-07-15 NOTE — Interval H&P Note (Signed)
History and Physical Interval Note:  07/15/2016 7:48 AM  James [name]  has presented today for surgery, with the diagnosis of Colostomy in place  The various methods of treatment have been discussed with the patient and family. After consideration of risks, benefits and other options for treatment, the patient has consented to  Procedure(s): COLOSTOMY TAKEDOWN (N/A) as a surgical intervention .  The patient's history has been reviewed, patient examined, no change in status, stable for surgery.  I have reviewed the patient's chart and labs.  Questions were answered to the patient's satisfaction.     Rigel Filsinger E
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
## 2016-07-15 NOTE — Anesthesia Postprocedure Evaluation (Signed)
Anesthesia Post Note  Patient: James [name]  Procedure(s) Performed: Procedure(s) (LRB): COLOSTOMY TAKEDOWN (N/A)  Patient location during evaluation: PACU Anesthesia Type: General Level of consciousness: awake Pain management: pain level controlled Vital Signs Assessment: post-procedure vital signs reviewed and stable Respiratory status: spontaneous breathing Cardiovascular status: stable Anesthetic complications: no    Last Vitals:  Vitals:   07/15/16 1110 07/15/16 1125  BP: 118/66 118/62  Pulse: 92 90  Resp: 17 17  Temp:  36.9 C    Last Pain:  Vitals:   07/15/16 1224  TempSrc:   PainSc: Asleep                 EDWARDS,Denaya Horn

## 2016-07-16 ENCOUNTER — Encounter (HOSPITAL_COMMUNITY): Payer: Self-pay | Admitting: General Surgery
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
## 2016-07-16 NOTE — Progress Notes (Signed)
Pt has a temp of 101.7 orally. Pt using incentive spirometer, reached 1000. Will give tylenol. Made Dr. Derrell Lollingamirez aware. No new order at this time. Will continue to monitor pt.

## 2016-07-16 NOTE — Progress Notes (Signed)
Patient ID: Lillie ColumbiaGeonte Deihl, male   DOB: 1996/10/02, 20 y.o.   MRN: [id number]   LOS: 1 day   POD#1  Subjective: Denies N/V/flatus. Pain controlled.   Objective: Vital signs in last 24 hours: Temp:  [97.9 F (36.6 C)-100 F (37.8 C)] 100 F (37.8 C) (10/13 0503) Pulse Rate:  [83-109] 105 (10/13 0503) Resp:  [16-28] 18 (10/13 0503) BP: (110-137)/(54-66) 110/58 (10/13 0503) SpO2:  [97 %-100 %] 97 % (10/13 0503) Weight:  [81.7 kg (180 lb 0.8 oz)] 81.7 kg (180 lb 0.8 oz) (10/12 1125) Last BM Date:  (previous colostomy)   Physical Exam General appearance: alert and no distress Resp: clear to auscultation bilaterally Cardio: regular rate and rhythm GI: Soft, absent BS, dressings bloody   Assessment/Plan: S/p colostomy takedown  FEN -- Await resolution of ileus VTE -- SCD's, Lovenox Dispo -- Ileus    Freeman CaldronMichael J. Noble Cicalese, PA-C Pager: [phone] General Trauma PA Pager: [phone]  07/16/2016

## 2016-07-17 LAB — CBC
HEMATOCRIT: 38.2 % — AB (ref 39.0–52.0)
HEMOGLOBIN: 12.3 g/dL — AB (ref 13.0–17.0)
MCH: 27.7 pg (ref 26.0–34.0)
MCHC: 32.2 g/dL (ref 30.0–36.0)
MCV: 86 fL (ref 78.0–100.0)
Platelets: 236 10*3/uL (ref 150–400)
RBC: 4.44 MIL/uL (ref 4.22–5.81)
RDW: 14.3 % (ref 11.5–15.5)
WBC: 15.4 10*3/uL — ABNORMAL HIGH (ref 4.0–10.5)

## 2016-07-17 LAB — BASIC METABOLIC PANEL
Anion gap: 8 (ref 5–15)
CHLORIDE: 98 mmol/L — AB (ref 101–111)
CO2: 29 mmol/L (ref 22–32)
CREATININE: 0.86 mg/dL (ref 0.61–1.24)
Calcium: 8.5 mg/dL — ABNORMAL LOW (ref 8.9–10.3)
GFR calc Af Amer: >60 mL/min (ref >60)
GFR calc non Af Amer: >60 mL/min (ref >60)
Glucose, Bld: 96 mg/dL (ref 65–99)
Potassium: 3.8 mmol/L (ref 3.5–5.1)
Sodium: 135 mmol/L (ref 135–145)

## 2016-07-17 NOTE — Progress Notes (Signed)
Patient ID: James [name], male   DOB: 11/03/1995, 20 y.o.   MRN: [id number]   LOS: 2 days   Subjective: NSC, no N/V/flatus but has had some stomach rumbling. Pain maybe a little worse but looks better.   Objective: Vital signs in last 24 hours: Temp:  [98 F (36.7 C)-101.7 F (38.7 C)] 98.6 F (37 C) (10/14 1015) Pulse Rate:  [82-113] 96 (10/14 1015) Resp:  [16-20] 20 (10/14 1015) BP: (104-130)/(55-85) 105/55 (10/14 1015) SpO2:  [95 %-100 %] 98 % (10/14 1015) FiO2 (%):  [98 %-100 %] 98 % (10/14 0337) Last BM Date: 07/14/16   Laboratory  CBC  Recent Labs  07/17/16 0716  WBC 15.4*  HGB 12.3*  HCT 38.2*  PLT 236   BMET  Recent Labs  07/17/16 0716  NA 135  K 3.8  CL 98*  CO2 29  GLUCOSE 96  BUN <5*  CREATININE 0.86  CALCIUM 8.5*    Physical Exam General appearance: alert and no distress Resp: clear to auscultation bilaterally Cardio: Mild tachycardia GI: Soft, minimal BS, incisions C/D/I   Assessment/Plan: S/p colostomy takedown  ID -- Fever and increased WBC worrisome, check CBC in am. Encourage pulmonary toilet. FEN -- Await resolution of ileus VTE -- SCD's, Lovenox Dispo -- Ileus    Freeman CaldronMichael J. Celestine Bougie, PA-C Pager: [phone] General Trauma PA Pager: [phone]  07/17/2016

## 2016-07-18 NOTE — Progress Notes (Signed)
3 Days Post-Op  Subjective: Pt doing well No c/o  Objective: Vital signs in last 24 hours: Temp:  [98.5 F (36.9 C)-99.5 F (37.5 C)] 98.6 F (37 C) (10/15 0542) Pulse Rate:  [95-114] 103 (10/15 0542) Resp:  [12-20] 14 (10/15 0756) BP: (105-122)/(55-70) 108/57 (10/15 0542) SpO2:  [96 %-100 %] 98 % (10/15 0756) Last BM Date: 07/14/16  Intake/Output from previous day: 10/14 0701 - 10/15 0700 In: 860 [P.O.:60; I.V.:800] Out: 525 [Urine:525] Intake/Output this shift: No intake/output data recorded.  General appearance: alert and cooperative GI: soft, approp ttp, incision c/d/i, hypoactive BS  Lab Results:   Recent Labs  07/17/16 0716  WBC 15.4*  HGB 12.3*  HCT 38.2*  PLT 236   BMET  Recent Labs  07/17/16 0716  NA 135  K 3.8  CL 98*  CO2 29  GLUCOSE 96  BUN <5*  CREATININE 0.86  CALCIUM 8.5*    Anti-infectives: Anti-infectives    Start     Dose/Rate Route Frequency Ordered Stop   07/15/16 0800  cefoTEtan in Dextrose 5% (CEFOTAN) IVPB 2 g  Status:  Discontinued     2 g Intravenous  Once 07/15/16 0645 07/15/16 1115   07/15/16 0657  cefoTEtan in Dextrose 5% (CEFOTAN) IVPB 2 g     2 g Intravenous On call to O.R. 07/15/16 0657 07/15/16 0807   07/15/16 0548  cefoTEtan in Dextrose 5% (CEFOTAN) 2-2.08 GM-% IVPB    Comments:  Edwina BarthHawks, Pamela   : cabinet override      07/15/16 0548 07/15/16 0807      Assessment/Plan: S/p colostomy takedown  FEN -- Con't on Clears for now VTE -- SCD's, Lovenox Dispo -- Ileus   LOS: 3 days    Marigene Ehlersamirez Jr., [facility] 07/18/2016

## 2016-07-19 MED ORDER — OXYCODONE HCL 5 MG PO TABS
5.0000 mg | ORAL_TABLET | ORAL | Status: DC | PRN
  Administered 2016-07-19 – 2016-07-20 (×3): 15 mg via ORAL
  Filled 2016-07-19 (×3): qty 3

## 2016-07-19 MED ORDER — HYDROMORPHONE HCL 1 MG/ML IJ SOLN: 1.0000 mg | INTRAMUSCULAR | Status: DC | PRN

## 2016-07-19 NOTE — Progress Notes (Signed)
4 Days Post-Op  Subjective: "rumbling" but no flatus  Objective: Vital signs in last 24 hours: Temp:  [98.2 F (36.8 C)-100 F (37.8 C)] 98.4 F (36.9 C) (10/16 0607) Pulse Rate:  [96-115] 100 (10/16 0607) Resp:  [12-16] 12 (10/16 0607) BP: (94-123)/(63-80) 123/64 (10/16 0607) SpO2:  [94 %-100 %] 97 % (10/16 0607) Last BM Date: 07/14/16  Intake/Output from previous day: 10/15 0701 - 10/16 0700 In: 1070 [P.O.:120; I.V.:950] Out: 1500 [Urine:1500] Intake/Output this shift: Total I/O In: 1070 [P.O.:120; I.V.:950] Out: 750 [Urine:750]  General appearance: cooperative Resp: clear to auscultation bilaterally Cardio: regular rate and rhythm GI: soft, milod dist, some BS, incisions OK  Lab Results: CBC   Recent Labs  07/17/16 0716  WBC 15.4*  HGB 12.3*  HCT 38.2*  PLT 236   BMET  Recent Labs  07/17/16 0716  NA 135  K 3.8  CL 98*  CO2 29  GLUCOSE 96  BUN <5*  CREATININE 0.86  CALCIUM 8.5*   PT/INR No results for input(s): LABPROT, INR in the last 72 hours. ABG No results for input(s): PHART, HCO3 in the last 72 hours.  Invalid input(s): PCO2, PO2  Studies/Results: No results found.  Anti-infectives: Anti-infectives    Start     Dose/Rate Route Frequency Ordered Stop   07/15/16 0800  cefoTEtan in Dextrose 5% (CEFOTAN) IVPB 2 g  Status:  Discontinued     2 g Intravenous  Once 07/15/16 0645 07/15/16 1115   07/15/16 0657  cefoTEtan in Dextrose 5% (CEFOTAN) IVPB 2 g     2 g Intravenous On call to O.R. 07/15/16 0657 07/15/16 0807   07/15/16 0548  cefoTEtan in Dextrose 5% (CEFOTAN) 2-2.08 GM-% IVPB    Comments:  Edwina BarthHawks, Pamela   : cabinet override      07/15/16 0548 07/15/16 0807      Assessment/Plan: s/p Procedure(s): COLOSTOMY TAKEDOWN POD#4 Await bowel function On Entereg Clears Oxy PO plus dilaudid for breakthrough VTE - Lovenox  LOS: 4 days    Violeta GelinasBurke James Hairfield, MD, MPH, FACS Trauma: [phone] General Surgery:  [phone]  10/16/2017Patient ID: James ColumbiaGeonte [name], male   DOB: 1995/10/24, 20 y.o.   MRN: [id number]

## 2016-07-20 MED ORDER — HYDROCODONE-ACETAMINOPHEN 10-325 MG PO TABS
0.5000 | ORAL_TABLET | ORAL | Status: DC | PRN
  Administered 2016-07-20 – 2016-07-21 (×6): 2 via ORAL
  Filled 2016-07-20 (×6): qty 2

## 2016-07-20 MED ORDER — HYDROMORPHONE HCL 2 MG/ML IJ SOLN: 1.0000 mg | INTRAMUSCULAR | Status: DC | PRN

## 2016-07-20 NOTE — Progress Notes (Signed)
5 Days Post-Op  Subjective: Passing gas and had a BM  Objective: Vital signs in last 24 hours: Temp:  [98.2 F (36.8 C)-99.8 F (37.7 C)] 98.6 F (37 C) (10/17 0510) Pulse Rate:  [100-105] 102 (10/17 0510) Resp:  [16-17] 17 (10/17 0510) BP: (116-126)/(65-72) 116/66 (10/17 0510) SpO2:  [97 %-99 %] 99 % (10/17 0510) Last BM Date: 07/19/16  Intake/Output from previous day: 10/16 0701 - 10/17 0700 In: 1334.6 [P.O.:120; I.V.:1214.6] Out: 400 [Urine:400] Intake/Output this shift: Total I/O In: 795 [P.O.:120; I.V.:675] Out: -   General appearance: alert and cooperative Resp: clear to auscultation bilaterally Cardio: regular rate and rhythm GI: soft, less distended, incisions OK, +BS  Lab Results: CBC   Recent Labs  07/17/16 0716  WBC 15.4*  HGB 12.3*  HCT 38.2*  PLT 236   BMET  Recent Labs  07/17/16 0716  NA 135  K 3.8  CL 98*  CO2 29  GLUCOSE 96  BUN <5*  CREATININE 0.86  CALCIUM 8.5*   PT/INR No results for input(s): LABPROT, INR in the last 72 hours. ABG No results for input(s): PHART, HCO3 in the last 72 hours.  Invalid input(s): PCO2, PO2  Studies/Results: No results found.  Anti-infectives: Anti-infectives    Start     Dose/Rate Route Frequency Ordered Stop   07/15/16 0800  cefoTEtan in Dextrose 5% (CEFOTAN) IVPB 2 g  Status:  Discontinued     2 g Intravenous  Once 07/15/16 0645 07/15/16 1115   07/15/16 0657  cefoTEtan in Dextrose 5% (CEFOTAN) IVPB 2 g     2 g Intravenous On call to O.R. 07/15/16 0657 07/15/16 0807   07/15/16 0548  cefoTEtan in Dextrose 5% (CEFOTAN) 2-2.08 GM-% IVPB    Comments:  Edwina BarthHawks, Pamela   : cabinet override      07/15/16 0548 07/15/16 0807      Assessment/Plan: s/p Procedure(s): COLOSTOMY TAKEDOWN Reg diet D/C IVF Check this PM for possible D/C VTE - Lovenox  LOS: 5 days    Violeta GelinasBurke Tiffane Sheldon, MD, MPH, FACS Trauma: [phone] General Surgery: [phone]  10/17/2017Patient ID: James ColumbiaGeonte [name], male   DOB:  11-24-1995, 20 y.o.   MRN: [id number]
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
## 2016-07-21 MED ORDER — HYDROCODONE-ACETAMINOPHEN 10-325 MG PO TABS: 1.0000 | ORAL_TABLET | ORAL | 0 refills | Status: DC | PRN
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
## 2016-07-21 NOTE — Discharge Instructions (Signed)
No lifting more than 5 pounds for 6 weeks.  No driving while taking hydrocodone.  Wash wounds daily in shower with soap and water. Do not soak. Apply antibiotic ointment (e.g. Neosporin) twice daily and as needed to keep moist. Cover with dry dressing if desired.

## 2016-07-21 NOTE — Progress Notes (Signed)
Patient ID: James [name], male   DOB: [birth year], 20 y.o.   MRN: [id number]   LOS: 6 days   Subjective: No new c/o. Ready to go home.   Objective: Vital signs in last 24 hours: Temp:  [98 F (36.7 C)-98.9 F (37.2 C)] 98 F (36.7 C) (10/18 0600) Pulse Rate:  [90-94] 90 (10/18 0600) Resp:  [16-18] 16 (10/18 0600) BP: (105-110)/(58-66) 106/62 (10/18 0600) SpO2:  [99 %-100 %] 99 % (10/18 0600) Last BM Date: 07/19/16   Physical Exam General appearance: alert and no distress Resp: clear to auscultation bilaterally Cardio: regular rate and rhythm GI: normal findings: bowel sounds normal and soft   Assessment/Plan: S/p colostomy takedown  FEN-- Tolerating diet VTE-- SCD's, Lovenox Dispo-- D/C home    Freeman CaldronMichael J. Jacobey Gura, PA-C Pager: [phone] General Trauma PA Pager: [phone]  07/21/2016

## 2016-07-21 NOTE — Progress Notes (Signed)
Pt discharged to home.  Discharge instructions explained to pt.  Pt has no questions at the time of discharge.  IV removed.  Pt states he has all belongings.  Pt waiting on ride.  Encouraged him to call if he needed anything.  Will continue to monitor.

## 2016-07-24 ENCOUNTER — Encounter (HOSPITAL_COMMUNITY): Payer: Self-pay | Admitting: *Deleted

## 2016-07-24 ENCOUNTER — Emergency Department (HOSPITAL_COMMUNITY)
Admission: EM | Admit: 2016-07-24 | Discharge: 2016-07-24 | Disposition: A | Discharge status: Home / Self Care | Payer: Medicaid Other | Attending: Emergency Medicine | Admitting: Emergency Medicine

## 2016-07-24 DIAGNOSIS — T814XXA Infection following a procedure, initial encounter: Secondary | ICD-10-CM | POA: Diagnosis not present

## 2016-07-24 DIAGNOSIS — IMO0001 Reserved for inherently not codable concepts without codable children: Secondary | ICD-10-CM

## 2016-07-24 DIAGNOSIS — Y620 Failure of sterile precautions during surgical operation: Secondary | ICD-10-CM | POA: Diagnosis not present

## 2016-07-24 LAB — COMPREHENSIVE METABOLIC PANEL
ALBUMIN: 3.4 g/dL — AB (ref 3.5–5.0)
ALT: 34 U/L (ref 17–63)
AST: 34 U/L (ref 15–41)
Alkaline Phosphatase: 98 U/L (ref 38–126)
Anion gap: 9 (ref 5–15)
BUN: 10 mg/dL (ref 6–20)
CHLORIDE: 103 mmol/L (ref 101–111)
CO2: 25 mmol/L (ref 22–32)
CREATININE: 0.85 mg/dL (ref 0.61–1.24)
Calcium: 9.3 mg/dL (ref 8.9–10.3)
GFR calc non Af Amer: >60 mL/min (ref >60)
GLUCOSE: 110 mg/dL — AB (ref 65–99)
Potassium: 4.7 mmol/L (ref 3.5–5.1)
SODIUM: 137 mmol/L (ref 135–145)
Total Bilirubin: 0.6 mg/dL (ref 0.3–1.2)
Total Protein: 7.6 g/dL (ref 6.5–8.1)

## 2016-07-24 LAB — LACTIC ACID, PLASMA: LACTIC ACID, VENOUS: 1 mmol/L (ref 0.5–1.9)

## 2016-07-24 LAB — CBC WITH DIFFERENTIAL/PLATELET
Basophils Absolute: 0 10*3/uL (ref 0.0–0.1)
Basophils Relative: 0 %
EOS ABS: 0.3 10*3/uL (ref 0.0–0.7)
Eosinophils Relative: 4 %
HEMATOCRIT: 42 % (ref 39.0–52.0)
HEMOGLOBIN: 13.9 g/dL (ref 13.0–17.0)
LYMPHS ABS: 1.7 10*3/uL (ref 0.7–4.0)
Lymphocytes Relative: 22 %
MCH: 27.6 pg (ref 26.0–34.0)
MCHC: 33.1 g/dL (ref 30.0–36.0)
MCV: 83.3 fL (ref 78.0–100.0)
MONOS PCT: 10 %
Monocytes Absolute: 0.8 10*3/uL (ref 0.1–1.0)
NEUTROS PCT: 64 %
Neutro Abs: 4.9 10*3/uL (ref 1.7–7.7)
Platelets: 349 10*3/uL (ref 150–400)
RBC: 5.04 MIL/uL (ref 4.22–5.81)
RDW: 13.6 % (ref 11.5–15.5)
WBC: 7.7 10*3/uL (ref 4.0–10.5)

## 2016-07-24 MED ORDER — HYDROCODONE-ACETAMINOPHEN 5-325 MG PO TABS
1.0000 | ORAL_TABLET | Freq: Once | ORAL | Status: AC
  Administered 2016-07-24: 1 via ORAL
  Filled 2016-07-24: qty 1

## 2016-07-24 MED ORDER — CEPHALEXIN 500 MG PO CAPS: 500.0000 mg | ORAL_CAPSULE | Freq: Four times a day (QID) | ORAL | 0 refills | Status: DC

## 2016-07-24 MED ORDER — SULFAMETHOXAZOLE-TRIMETHOPRIM 800-160 MG PO TABS: 1.0000 | ORAL_TABLET | Freq: Two times a day (BID) | ORAL | 0 refills | Status: AC

## 2016-07-24 MED ORDER — SULFAMETHOXAZOLE-TRIMETHOPRIM 800-160 MG PO TABS
1.0000 | ORAL_TABLET | Freq: Once | ORAL | Status: AC
  Administered 2016-07-24: 1 via ORAL
  Filled 2016-07-24: qty 1

## 2016-07-24 NOTE — ED Triage Notes (Signed)
Pt reports having surgery to abd on 10/12 having consistent bleeding from incision since. No acute distress noted at triage.
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
## 2016-07-27 LAB — AEROBIC CULTURE W GRAM STAIN (SUPERFICIAL SPECIMEN): Special Requests: NORMAL

## 2016-07-27 LAB — AEROBIC CULTURE  (SUPERFICIAL SPECIMEN)

## 2016-07-28 ENCOUNTER — Telehealth (HOSPITAL_BASED_OUTPATIENT_CLINIC_OR_DEPARTMENT_OTHER): Payer: Self-pay | Admitting: Emergency Medicine

## 2016-07-28 NOTE — Telephone Encounter (Signed)
Post ED Visit - Positive Culture Follow-up: Successful Patient Follow-Up  Culture assessed and recommendations reviewed by: []  Enzo BiNathan Batchelder, Pharm.D. []  Celedonio MiyamotoJeremy Frens, Pharm.D., BCPS []  Garvin FilaMike Maccia, Pharm.D. []  Georgina PillionElizabeth Martin, Pharm.D., BCPS []  PeridotMinh Pham, [address] BoulevardPharm.D., BCPS, AAHIVP []  Estella HuskMichelle Turner, Pharm.D., BCPS, AAHIVP []  Tennis Mustassie Stewart, Pharm.D. []  Rob Oswaldo DoneVincent, [address] BoulevardPharm[address]D. Casilda Carlsaylor Stone PharmD  Positive wound culture  []  Patient discharged without antimicrobial prescription and treatment is now indicated [x]  Organism is resistant to prescribed ED discharge antimicrobial []  Patient with positive blood cultures  Changes discussed with ED provider:Tayana Kirichenko PA         New antibiotic prescription Ciprofloxacin 500mg  po bid x 10 days [facility] [phone]  Contacted patient 07/28/16 1057   Berle MullMiller, Sulamita [place] 07/28/2016, 10:57 AM

## 2016-07-28 NOTE — Progress Notes (Signed)
ED Antimicrobial Stewardship Positive Culture Follow Up   James [name] is an 20 y.o. male who presented to [facility] on 07/24/2016 with a chief complaint of  Chief Complaint  Patient presents with  . Post-op Problem    Recent Results (from the past 720 hour(s))  Wound or Superficial Culture     Status: None   Collection Time: 07/24/16  1:01 PM  Result Value Ref Range Status   Specimen Description ABDOMEN  Final   Special Requests Normal  Final   Gram Stain   Final    DEGENERATED CELLULAR MATERIAL PRESENT ABUNDANT GRAM NEGATIVE RODS    Culture ABUNDANT PSEUDOMONAS AERUGINOSA  Final   Report Status 07/27/2016 FINAL  Final   Organism ID, Bacteria PSEUDOMONAS AERUGINOSA  Final      Susceptibility   Pseudomonas aeruginosa - MIC*    CEFTAZIDIME 4 SENSITIVE Sensitive     CIPROFLOXACIN 1 SENSITIVE Sensitive     GENTAMICIN <=1 SENSITIVE Sensitive     IMIPENEM 1 SENSITIVE Sensitive     PIP/TAZO 32 SENSITIVE Sensitive     CEFEPIME 8 SENSITIVE Sensitive     * ABUNDANT PSEUDOMONAS AERUGINOSA    [x]  Treated with cephalexin and bactrim, organism resistant to prescribed antimicrobial  New antibiotic prescription: Ciprofloxacin 500 mg BID x 10 days  ED Provider: Jaynie Crumbleatyana Kirichenko, PA-C  Casilda Carlsaylor Catalaya Garr, PharmD. PGY-2 Infectious Diseases Pharmacy Resident Pager: [phone] 07/28/2016, 9:24 AM

## 2016-08-04 NOTE — Telephone Encounter (Signed)
Noted  

## 2016-08-12 ENCOUNTER — Ambulatory Visit: Payer: Medicaid Other | Attending: Physical Medicine & Rehabilitation | Admitting: Occupational Therapy

## 2016-08-12 DIAGNOSIS — R278 Other lack of coordination: Secondary | ICD-10-CM

## 2016-08-12 DIAGNOSIS — R208 Other disturbances of skin sensation: Secondary | ICD-10-CM | POA: Diagnosis present

## 2016-08-12 DIAGNOSIS — R2681 Unsteadiness on feet: Secondary | ICD-10-CM

## 2016-08-12 DIAGNOSIS — M79642 Pain in left hand: Secondary | ICD-10-CM

## 2016-08-12 DIAGNOSIS — M6281 Muscle weakness (generalized): Secondary | ICD-10-CM

## 2016-08-12 DIAGNOSIS — M79641 Pain in right hand: Secondary | ICD-10-CM
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
## 2016-08-25 ENCOUNTER — Encounter: Payer: Medicaid Other | Attending: Physical Medicine & Rehabilitation | Admitting: Physical Medicine & Rehabilitation

## 2016-08-25 ENCOUNTER — Telehealth: Payer: Self-pay | Admitting: *Deleted

## 2016-08-25 ENCOUNTER — Encounter: Payer: Self-pay | Admitting: Physical Medicine & Rehabilitation

## 2016-08-25 ENCOUNTER — Ambulatory Visit: Payer: Medicaid Other | Admitting: Physical Medicine & Rehabilitation

## 2016-08-25 VITALS — BP 115/77 | HR 77 | Resp 14

## 2016-08-25 DIAGNOSIS — S12300D Unspecified displaced fracture of fourth cervical vertebra, subsequent encounter for fracture with routine healing: Secondary | ICD-10-CM | POA: Diagnosis not present

## 2016-08-25 DIAGNOSIS — S14129S Central cord syndrome at unspecified level of cervical spinal cord, sequela: Secondary | ICD-10-CM | POA: Diagnosis not present

## 2016-08-25 DIAGNOSIS — G894 Chronic pain syndrome: Secondary | ICD-10-CM | POA: Insufficient documentation

## 2016-08-25 DIAGNOSIS — M792 Neuralgia and neuritis, unspecified: Secondary | ICD-10-CM

## 2016-08-25 DIAGNOSIS — S31109D Unspecified open wound of abdominal wall, unspecified quadrant without penetration into peritoneal cavity, subsequent encounter: Secondary | ICD-10-CM | POA: Diagnosis not present

## 2016-08-25 DIAGNOSIS — G629 Polyneuropathy, unspecified: Secondary | ICD-10-CM | POA: Diagnosis not present

## 2016-08-25 DIAGNOSIS — S14124D Central cord syndrome at C4 level of cervical spinal cord, subsequent encounter: Secondary | ICD-10-CM | POA: Insufficient documentation

## 2016-08-25 DIAGNOSIS — S14109S Unspecified injury at unspecified level of cervical spinal cord, sequela: Secondary | ICD-10-CM | POA: Diagnosis not present

## 2016-08-25 DIAGNOSIS — G479 Sleep disorder, unspecified: Secondary | ICD-10-CM | POA: Diagnosis not present

## 2016-08-25 MED ORDER — HYDROCODONE-ACETAMINOPHEN 10-325 MG PO TABS: 1.0000 | ORAL_TABLET | Freq: Four times a day (QID) | ORAL | 0 refills | Status: DC | PRN

## 2016-08-25 MED ORDER — AMITRIPTYLINE HCL 50 MG PO TABS: 50.0000 mg | ORAL_TABLET | Freq: Every day | ORAL | 3 refills | Status: DC

## 2016-08-25 NOTE — Telephone Encounter (Signed)
[facility] 10/325 #[id number]
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
## 2016-08-25 NOTE — Patient Instructions (Signed)
WORK ON USING DIFFERENT TEMPERATURES AND TEXTURES TO HELP WITH YOUR HAND/ARM PAIN.  USE YOUR HANDS AS MUCH AS YOU CAN DURING THE DAY  PLEASE CALL ME WITH ANY PROBLEMS OR QUESTIONS [phone])   HAPPY THANKSGIVING!!!!

## 2016-08-30 ENCOUNTER — Telehealth: Payer: Self-pay | Admitting: Physical Medicine & Rehabilitation

## 2016-08-30 NOTE — Telephone Encounter (Signed)
Patient has called to let us know that the pharmacy is telling him to call us that we need to call Medicaid and [facility] the [facility] for [facility].  Any questions please call patient.

## 2016-08-30 NOTE — Telephone Encounter (Signed)
I am not sure what James [name] is saying and pharmacy did not know either. I left a message for him to call and explain.

## 2016-08-31 ENCOUNTER — Ambulatory Visit: Payer: Medicaid Other | Admitting: Occupational Therapy

## 2016-09-03 ENCOUNTER — Ambulatory Visit: Payer: Medicaid Other | Attending: Physical Medicine & Rehabilitation | Admitting: Occupational Therapy

## 2016-09-03 NOTE — Telephone Encounter (Signed)
[facility] 08/26/16-02/22/17

## 2016-09-07 ENCOUNTER — Ambulatory Visit: Payer: Medicaid Other | Admitting: Occupational Therapy

## 2016-09-10 ENCOUNTER — Ambulatory Visit: Payer: Medicaid Other | Admitting: Occupational Therapy

## 2016-09-14 ENCOUNTER — Ambulatory Visit: Payer: Medicaid Other | Admitting: Occupational Therapy

## 2016-09-17 ENCOUNTER — Encounter: Payer: Medicaid Other | Admitting: Occupational Therapy

## 2016-09-20 ENCOUNTER — Encounter: Payer: Medicaid Other | Admitting: Occupational Therapy

## 2016-09-21 ENCOUNTER — Encounter: Payer: Medicaid Other | Admitting: Occupational Therapy

## 2016-09-24 ENCOUNTER — Encounter: Payer: Medicaid Other | Admitting: Occupational Therapy

## 2016-09-29 ENCOUNTER — Encounter: Payer: Medicaid Other | Admitting: Occupational Therapy

## 2016-09-30 ENCOUNTER — Telehealth: Payer: Self-pay | Admitting: *Deleted

## 2016-09-30 DIAGNOSIS — S14109S Unspecified injury at unspecified level of cervical spinal cord, sequela: Secondary | ICD-10-CM

## 2016-09-30 DIAGNOSIS — S14129S Central cord syndrome at unspecified level of cervical spinal cord, sequela: Secondary | ICD-10-CM

## 2016-09-30 DIAGNOSIS — M792 Neuralgia and neuritis, unspecified: Secondary | ICD-10-CM

## 2016-09-30 NOTE — Telephone Encounter (Signed)
Patient was seen last month by Dr. Riley [facility]Swartz and was given a 1 month supply of hydrocodone and was asked to schedule return visit for 2 months.  He is scheduled for January 22nd with Dr. Riley KillSwartz. He is asking for his 2nd months script. Please advise

## 2016-10-01 MED ORDER — HYDROCODONE-ACETAMINOPHEN 10-325 MG PO TABS: 1.0000 | ORAL_TABLET | Freq: Four times a day (QID) | ORAL | 0 refills | Status: DC | PRN

## 2016-10-01 NOTE — Telephone Encounter (Signed)
It can be refilled today with #75 only: Idea was to decrease use of the hydrocodone with the other changes which were made.   "3. Pain Syndrome related to above. ?lower cervical root injury -Naprosyn 500 mg twice a day,   -increase lyrica 200mg TID--  -depakote-continue at 500mg  BID as a trial for neruopathic pain -refill hydrocodone 10/325 q8prn for breakthrough pain, #90             -increase elavil to 50mg  qhs to help with nocturnal symptoms"

## 2016-10-01 NOTE — Telephone Encounter (Signed)
Refill printed for James [name] to sign and patient to be made aware of decrease and must last until 10/25/16 appt.

## 2016-10-05 ENCOUNTER — Encounter: Payer: Medicaid Other | Admitting: Occupational Therapy

## 2016-10-07 ENCOUNTER — Encounter: Payer: Medicaid Other | Admitting: Occupational Therapy

## 2016-10-12 ENCOUNTER — Encounter: Payer: Medicaid Other | Admitting: Occupational Therapy

## 2016-10-14 ENCOUNTER — Encounter: Payer: Medicaid Other | Admitting: Occupational Therapy

## 2016-10-25 ENCOUNTER — Encounter: Payer: Medicaid Other | Attending: Physical Medicine & Rehabilitation | Admitting: Physical Medicine & Rehabilitation

## 2016-10-25 DIAGNOSIS — G894 Chronic pain syndrome: Secondary | ICD-10-CM | POA: Insufficient documentation

## 2016-10-25 DIAGNOSIS — S31109D Unspecified open wound of abdominal wall, unspecified quadrant without penetration into peritoneal cavity, subsequent encounter: Secondary | ICD-10-CM | POA: Insufficient documentation

## 2016-10-25 DIAGNOSIS — S12300D Unspecified displaced fracture of fourth cervical vertebra, subsequent encounter for fracture with routine healing: Secondary | ICD-10-CM | POA: Insufficient documentation

## 2016-10-25 DIAGNOSIS — S14124D Central cord syndrome at C4 level of cervical spinal cord, subsequent encounter: Secondary | ICD-10-CM | POA: Insufficient documentation

## 2016-10-25 DIAGNOSIS — G479 Sleep disorder, unspecified: Secondary | ICD-10-CM | POA: Insufficient documentation

## 2016-10-25 DIAGNOSIS — G629 Polyneuropathy, unspecified: Secondary | ICD-10-CM | POA: Insufficient documentation

## 2016-11-02 ENCOUNTER — Encounter: Payer: Self-pay | Admitting: Registered Nurse

## 2016-11-02 ENCOUNTER — Encounter (HOSPITAL_BASED_OUTPATIENT_CLINIC_OR_DEPARTMENT_OTHER): Payer: Medicaid Other | Admitting: Registered Nurse

## 2016-11-02 VITALS — BP 131/82 | HR 73

## 2016-11-02 DIAGNOSIS — Z79899 Other long term (current) drug therapy: Secondary | ICD-10-CM

## 2016-11-02 DIAGNOSIS — S14129S Central cord syndrome at unspecified level of cervical spinal cord, sequela: Secondary | ICD-10-CM

## 2016-11-02 DIAGNOSIS — S14109S Unspecified injury at unspecified level of cervical spinal cord, sequela: Secondary | ICD-10-CM | POA: Diagnosis not present

## 2016-11-02 DIAGNOSIS — G894 Chronic pain syndrome: Secondary | ICD-10-CM | POA: Diagnosis not present

## 2016-11-02 DIAGNOSIS — G479 Sleep disorder, unspecified: Secondary | ICD-10-CM | POA: Diagnosis not present

## 2016-11-02 DIAGNOSIS — R203 Hyperesthesia: Secondary | ICD-10-CM

## 2016-11-02 DIAGNOSIS — IMO0002 Reserved for concepts with insufficient information to code with codable children: Secondary | ICD-10-CM

## 2016-11-02 DIAGNOSIS — S14124D Central cord syndrome at C4 level of cervical spinal cord, subsequent encounter: Secondary | ICD-10-CM | POA: Diagnosis not present

## 2016-11-02 DIAGNOSIS — M792 Neuralgia and neuritis, unspecified: Secondary | ICD-10-CM

## 2016-11-02 DIAGNOSIS — Z5181 Encounter for therapeutic drug level monitoring: Secondary | ICD-10-CM

## 2016-11-02 DIAGNOSIS — S12300D Unspecified displaced fracture of fourth cervical vertebra, subsequent encounter for fracture with routine healing: Secondary | ICD-10-CM | POA: Diagnosis not present

## 2016-11-02 DIAGNOSIS — S31109D Unspecified open wound of abdominal wall, unspecified quadrant without penetration into peritoneal cavity, subsequent encounter: Secondary | ICD-10-CM | POA: Diagnosis not present

## 2016-11-02 DIAGNOSIS — Z9889 Other specified postprocedural states: Secondary | ICD-10-CM

## 2016-11-02 DIAGNOSIS — G629 Polyneuropathy, unspecified: Secondary | ICD-10-CM | POA: Diagnosis not present

## 2016-11-02 MED ORDER — HYDROCODONE-ACETAMINOPHEN 10-325 MG PO TABS: 1.0000 | ORAL_TABLET | Freq: Four times a day (QID) | ORAL | 0 refills | Status: DC | PRN

## 2016-11-02 MED ORDER — DIVALPROEX SODIUM 500 MG PO DR TAB
500.0000 mg | DELAYED_RELEASE_TABLET | Freq: Two times a day (BID) | ORAL | 4 refills | Status: DC
Start: 2016-11-02 — End: 2021-10-07

## 2016-11-02 MED ORDER — PREGABALIN 150 MG PO CAPS: 300.0000 mg | ORAL_CAPSULE | Freq: Two times a day (BID) | ORAL | 2 refills | Status: DC
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
## 2016-11-03 ENCOUNTER — Telehealth: Payer: Self-pay | Admitting: Registered Nurse

## 2016-11-03 NOTE — Telephone Encounter (Signed)
On January 31,2018 the  NCCSR was reviewed no conflict was seen on the [place] Cont[place] Sub[place] Reporting System with multiple prescribers. James [name] has a signed narcotic contract with our office. If there were any discrepancies this would have been reported to his physician.

## 2016-11-05 ENCOUNTER — Telehealth: Payer: Self-pay

## 2016-11-05 NOTE — Telephone Encounter (Signed)
Patient called requesting a letter or note stating that he cannot work at moment due to [employer]pain/injury

## 2016-11-07 LAB — TOXASSURE SELECT,+ANTIDEPR,UR

## 2016-11-08 ENCOUNTER — Encounter: Payer: Self-pay | Admitting: *Deleted

## 2016-11-08 NOTE — Telephone Encounter (Signed)
Patient called about letter to see if had been written yet. Please call patient when this is completed.

## 2016-11-08 NOTE — Telephone Encounter (Signed)
Message was first reviewed Friday evening my me. It is now done

## 2016-11-08 NOTE — Telephone Encounter (Signed)
Opened in error

## 2016-11-08 NOTE — Telephone Encounter (Signed)
Kao notified.

## 2016-11-26 NOTE — Progress Notes (Signed)
Urine Drug Screen is consistent with what James [name] reported (last taken 10/05/16 because Social services only fills once a month) and the drug and metabolites were absent but this does not match NCCSR report that shows hydrocodone was filled 10/01/16 #75 so he should have had medication to take. UDS will need to be repeated

## 2016-11-29 ENCOUNTER — Encounter: Payer: Medicaid Other | Admitting: Registered Nurse

## 2016-11-30 ENCOUNTER — Encounter: Payer: Self-pay | Admitting: Registered Nurse

## 2016-11-30 ENCOUNTER — Encounter: Payer: Medicaid Other | Attending: Physical Medicine & Rehabilitation | Admitting: Registered Nurse

## 2016-11-30 VITALS — BP 130/84 | HR 112

## 2016-11-30 DIAGNOSIS — M792 Neuralgia and neuritis, unspecified: Secondary | ICD-10-CM | POA: Diagnosis not present

## 2016-11-30 DIAGNOSIS — G479 Sleep disorder, unspecified: Secondary | ICD-10-CM | POA: Insufficient documentation

## 2016-11-30 DIAGNOSIS — S31109D Unspecified open wound of abdominal wall, unspecified quadrant without penetration into peritoneal cavity, subsequent encounter: Secondary | ICD-10-CM | POA: Diagnosis not present

## 2016-11-30 DIAGNOSIS — Z5181 Encounter for therapeutic drug level monitoring: Secondary | ICD-10-CM

## 2016-11-30 DIAGNOSIS — G629 Polyneuropathy, unspecified: Secondary | ICD-10-CM | POA: Insufficient documentation

## 2016-11-30 DIAGNOSIS — S14124D Central cord syndrome at C4 level of cervical spinal cord, subsequent encounter: Secondary | ICD-10-CM | POA: Diagnosis present

## 2016-11-30 DIAGNOSIS — S14109S Unspecified injury at unspecified level of cervical spinal cord, sequela: Secondary | ICD-10-CM

## 2016-11-30 DIAGNOSIS — G894 Chronic pain syndrome: Secondary | ICD-10-CM | POA: Diagnosis not present

## 2016-11-30 DIAGNOSIS — S14129S Central cord syndrome at unspecified level of cervical spinal cord, sequela: Secondary | ICD-10-CM

## 2016-11-30 DIAGNOSIS — S12300D Unspecified displaced fracture of fourth cervical vertebra, subsequent encounter for fracture with routine healing: Secondary | ICD-10-CM | POA: Diagnosis not present

## 2016-11-30 DIAGNOSIS — Z79899 Other long term (current) drug therapy: Secondary | ICD-10-CM

## 2016-11-30 MED ORDER — HYDROCODONE-ACETAMINOPHEN 10-325 MG PO TABS: 1.0000 | ORAL_TABLET | Freq: Four times a day (QID) | ORAL | 0 refills | Status: DC | PRN
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
## 2016-11-30 NOTE — Addendum Note (Signed)
Addended by: Barbee ShropshireBRIGHT, BRUCE B on: 11/30/2016 03:32 PM   Modules accepted: Orders

## 2016-12-05 LAB — TOXASSURE SELECT,+ANTIDEPR,UR

## 2016-12-09 ENCOUNTER — Telehealth: Payer: Self-pay | Admitting: *Deleted

## 2016-12-09 NOTE — Telephone Encounter (Signed)
Letter of formal warning for consuming alcohol while being prescribed narcotics was mailed.

## 2016-12-23 ENCOUNTER — Encounter: Payer: Self-pay | Admitting: Registered Nurse

## 2016-12-23 ENCOUNTER — Encounter: Payer: Medicaid Other | Attending: Physical Medicine & Rehabilitation | Admitting: Registered Nurse

## 2016-12-23 VITALS — BP 155/81 | HR 110

## 2016-12-23 DIAGNOSIS — S31109D Unspecified open wound of abdominal wall, unspecified quadrant without penetration into peritoneal cavity, subsequent encounter: Secondary | ICD-10-CM | POA: Diagnosis not present

## 2016-12-23 DIAGNOSIS — S14109S Unspecified injury at unspecified level of cervical spinal cord, sequela: Secondary | ICD-10-CM | POA: Diagnosis not present

## 2016-12-23 DIAGNOSIS — S14124D Central cord syndrome at C4 level of cervical spinal cord, subsequent encounter: Secondary | ICD-10-CM | POA: Diagnosis present

## 2016-12-23 DIAGNOSIS — Z79899 Other long term (current) drug therapy: Secondary | ICD-10-CM | POA: Diagnosis not present

## 2016-12-23 DIAGNOSIS — S14129S Central cord syndrome at unspecified level of cervical spinal cord, sequela: Secondary | ICD-10-CM

## 2016-12-23 DIAGNOSIS — S12300D Unspecified displaced fracture of fourth cervical vertebra, subsequent encounter for fracture with routine healing: Secondary | ICD-10-CM | POA: Insufficient documentation

## 2016-12-23 DIAGNOSIS — G629 Polyneuropathy, unspecified: Secondary | ICD-10-CM | POA: Insufficient documentation

## 2016-12-23 DIAGNOSIS — R203 Hyperesthesia: Secondary | ICD-10-CM | POA: Diagnosis not present

## 2016-12-23 DIAGNOSIS — G894 Chronic pain syndrome: Secondary | ICD-10-CM

## 2016-12-23 DIAGNOSIS — G4700 Insomnia, unspecified: Secondary | ICD-10-CM

## 2016-12-23 DIAGNOSIS — Z5181 Encounter for therapeutic drug level monitoring: Secondary | ICD-10-CM

## 2016-12-23 DIAGNOSIS — M792 Neuralgia and neuritis, unspecified: Secondary | ICD-10-CM | POA: Diagnosis not present

## 2016-12-23 DIAGNOSIS — G479 Sleep disorder, unspecified: Secondary | ICD-10-CM | POA: Insufficient documentation

## 2016-12-23 MED ORDER — HYDROCODONE-ACETAMINOPHEN 10-325 MG PO TABS: 1.0000 | ORAL_TABLET | Freq: Four times a day (QID) | ORAL | 0 refills | Status: DC | PRN

## 2016-12-23 MED ORDER — AMITRIPTYLINE HCL 50 MG PO TABS: 50.0000 mg | ORAL_TABLET | Freq: Every day | ORAL | 3 refills | Status: DC

## 2016-12-23 MED ORDER — PREGABALIN 150 MG PO CAPS: 300.0000 mg | ORAL_CAPSULE | Freq: Two times a day (BID) | ORAL | 2 refills | Status: DC
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
## 2017-01-10 ENCOUNTER — Other Ambulatory Visit: Payer: Self-pay | Admitting: Physical Medicine & Rehabilitation

## 2017-01-24 ENCOUNTER — Encounter: Payer: Self-pay | Attending: Physical Medicine & Rehabilitation | Admitting: Physical Medicine & Rehabilitation

## 2017-01-24 ENCOUNTER — Encounter: Payer: Self-pay | Admitting: Physical Medicine & Rehabilitation

## 2017-01-24 VITALS — BP 136/70 | HR 78 | Resp 14

## 2017-01-24 DIAGNOSIS — S14129S Central cord syndrome at unspecified level of cervical spinal cord, sequela: Secondary | ICD-10-CM

## 2017-01-24 DIAGNOSIS — G479 Sleep disorder, unspecified: Secondary | ICD-10-CM | POA: Insufficient documentation

## 2017-01-24 DIAGNOSIS — M792 Neuralgia and neuritis, unspecified: Secondary | ICD-10-CM

## 2017-01-24 DIAGNOSIS — G629 Polyneuropathy, unspecified: Secondary | ICD-10-CM | POA: Insufficient documentation

## 2017-01-24 DIAGNOSIS — S14104S Unspecified injury at C4 level of cervical spinal cord, sequela: Secondary | ICD-10-CM

## 2017-01-24 DIAGNOSIS — R202 Paresthesia of skin: Secondary | ICD-10-CM

## 2017-01-24 DIAGNOSIS — G894 Chronic pain syndrome: Secondary | ICD-10-CM | POA: Insufficient documentation

## 2017-01-24 DIAGNOSIS — R203 Hyperesthesia: Secondary | ICD-10-CM

## 2017-01-24 DIAGNOSIS — S14109S Unspecified injury at unspecified level of cervical spinal cord, sequela: Secondary | ICD-10-CM

## 2017-01-24 DIAGNOSIS — S14124D Central cord syndrome at C4 level of cervical spinal cord, subsequent encounter: Secondary | ICD-10-CM | POA: Insufficient documentation

## 2017-01-24 DIAGNOSIS — R200 Anesthesia of skin: Secondary | ICD-10-CM

## 2017-01-24 DIAGNOSIS — Z5181 Encounter for therapeutic drug level monitoring: Secondary | ICD-10-CM

## 2017-01-24 DIAGNOSIS — Z79899 Other long term (current) drug therapy: Secondary | ICD-10-CM

## 2017-01-24 DIAGNOSIS — S31109D Unspecified open wound of abdominal wall, unspecified quadrant without penetration into peritoneal cavity, subsequent encounter: Secondary | ICD-10-CM | POA: Insufficient documentation

## 2017-01-24 DIAGNOSIS — S12300D Unspecified displaced fracture of fourth cervical vertebra, subsequent encounter for fracture with routine healing: Secondary | ICD-10-CM | POA: Insufficient documentation

## 2017-01-24 MED ORDER — PREGABALIN 150 MG PO CAPS: 300.0000 mg | ORAL_CAPSULE | Freq: Two times a day (BID) | ORAL | 2 refills | Status: DC

## 2017-01-24 MED ORDER — AMITRIPTYLINE HCL 100 MG PO TABS: 100.0000 mg | ORAL_TABLET | Freq: Every day | ORAL | 5 refills | Status: DC

## 2017-01-24 MED ORDER — HYDROCODONE-ACETAMINOPHEN 10-325 MG PO TABS: 1.0000 | ORAL_TABLET | Freq: Four times a day (QID) | ORAL | 0 refills | Status: DC | PRN
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
## 2017-01-24 NOTE — Patient Instructions (Signed)
PLEASE FEEL FREE TO CALL OUR OFFICE WITH ANY PROBLEMS OR QUESTIONS ([phone])      

## 2017-01-25 ENCOUNTER — Telehealth: Payer: Self-pay

## 2017-01-25 NOTE — Telephone Encounter (Signed)
Called patient again and informed him of community health and wellness program, county orange card, and [place] finanical assistance program, patient informed me that he is out of all prescribed meds except for 10 of the hydrocodone

## 2017-01-25 NOTE — Telephone Encounter (Signed)
Yes. Why gralise? Can he get this for free?  daily x 9 days then  thereafter

## 2017-01-25 NOTE — Telephone Encounter (Signed)
Called patient and informed him of patient assistance program through [place], will call patient back this afternoon to arrange paperwork

## 2017-01-25 NOTE — Telephone Encounter (Signed)
Patient has called stating he can not pick up medication due to his insurance. Patient would like a call back.

## 2017-01-25 NOTE — Telephone Encounter (Signed)
Patient stated he is out of lyrica, is there a chance that gralise can be prescribed in its place?

## 2017-01-26 ENCOUNTER — Telehealth: Payer: Self-pay

## 2017-01-26 NOTE — Telephone Encounter (Signed)
Patient notified of Gralise sample medication packet and will be here today to pick up medication, education on use of medicine, and medication instructions

## 2017-01-26 NOTE — Telephone Encounter (Signed)
Gralise Starting on 01-26-2017     Take 1 white pill of Gralise  daily x 9 days until May 3rd    Starting on 02-04-2017      Take 1 tan pill of Gralise   daily thereafter

## 2017-01-29 LAB — TOXASSURE SELECT,+ANTIDEPR,UR

## 2017-01-31 ENCOUNTER — Telehealth: Payer: Self-pay | Admitting: *Deleted

## 2017-01-31 NOTE — Telephone Encounter (Signed)
+   UDS means no more hydrocodone moving forward.   I will only continue to write other medications including depakote, lyrica, elavil if he is abstinent from etoh. (although he has no insurance and we recently provided him gralise samples)

## 2017-01-31 NOTE — Telephone Encounter (Signed)
Urine drug screen for this encounter is inconsistent. It is positive for alcohol.  This is the second positive from our clinic. Last UDS in February was positive as well.  He was issued a formal warning 12/09/16. He has two previous positives from 9 mo ago and one year ago too.

## 2017-01-31 NOTE — Telephone Encounter (Addendum)
Flag placed for non narcotic tx and cancelled contract.  I have left a message for Enzo to call me (to discuss). After multiple attempts to reach [facility] without success, I left him a message notifying him of non narcotic treatment only from now on due to UDS. Encounter closed.

## 2017-02-17 ENCOUNTER — Telehealth: Payer: Self-pay | Admitting: Registered Nurse

## 2017-02-17 NOTE — Telephone Encounter (Signed)
On 02/17/2017 the NCCSR was reviewed no conflict was seen on the [name] ControlledSubstance [place] System with multiple prescribers. James [name] has a signed narcotic contract with our office. If there were any discrepancies this would have been reported to his physician.

## 2017-02-23 ENCOUNTER — Encounter: Payer: Self-pay | Attending: Physical Medicine & Rehabilitation | Admitting: Registered Nurse

## 2017-02-23 DIAGNOSIS — G894 Chronic pain syndrome: Secondary | ICD-10-CM | POA: Insufficient documentation

## 2017-02-23 DIAGNOSIS — S12300D Unspecified displaced fracture of fourth cervical vertebra, subsequent encounter for fracture with routine healing: Secondary | ICD-10-CM | POA: Insufficient documentation

## 2017-02-23 DIAGNOSIS — G629 Polyneuropathy, unspecified: Secondary | ICD-10-CM | POA: Insufficient documentation

## 2017-02-23 DIAGNOSIS — S31109D Unspecified open wound of abdominal wall, unspecified quadrant without penetration into peritoneal cavity, subsequent encounter: Secondary | ICD-10-CM | POA: Insufficient documentation

## 2017-02-23 DIAGNOSIS — S14124D Central cord syndrome at C4 level of cervical spinal cord, subsequent encounter: Secondary | ICD-10-CM | POA: Insufficient documentation

## 2017-02-23 DIAGNOSIS — G479 Sleep disorder, unspecified: Secondary | ICD-10-CM | POA: Insufficient documentation

## 2017-05-06 ENCOUNTER — Telehealth: Payer: Self-pay

## 2017-05-06 NOTE — Telephone Encounter (Signed)
Letter has been written. Stamp my signature on it if it needs to go out before I get back

## 2017-05-06 NOTE — Telephone Encounter (Signed)
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 He has not been seen since appt 01/24/17 with Dr Riley KillSwartz. He no showed the following appt and has nothing scheduled.  I explained Dr Riley KillSwartz is the only one that can sign this letter and he may not be available as he is off through 05/16/17. He says he cannot start work until he gets this letter. I will forward message to Dr Riley KillSwartz.

## 2017-05-06 NOTE — Telephone Encounter (Signed)
Left message for James [name] to call back.  He has not been seen since 01/2017. He no showed for appt and has not rescheduled.  He will have to get a note from his primary care.

## 2017-05-06 NOTE — Telephone Encounter (Signed)
Patient has called requesting a letter for work. Patient would like a call back regarding this letter.

## 2017-05-06 NOTE — Telephone Encounter (Signed)
[name] notified he can pick up letter.

## 2017-06-17 IMAGING — DX DG CHEST 2V
2 series · 2 of 2 positions shown · non-contrast
Comparison: 05/10/2016

CLINICAL DATA: Shortness of Breath

EXAM:
CHEST  2 VIEW

[w chest pa]
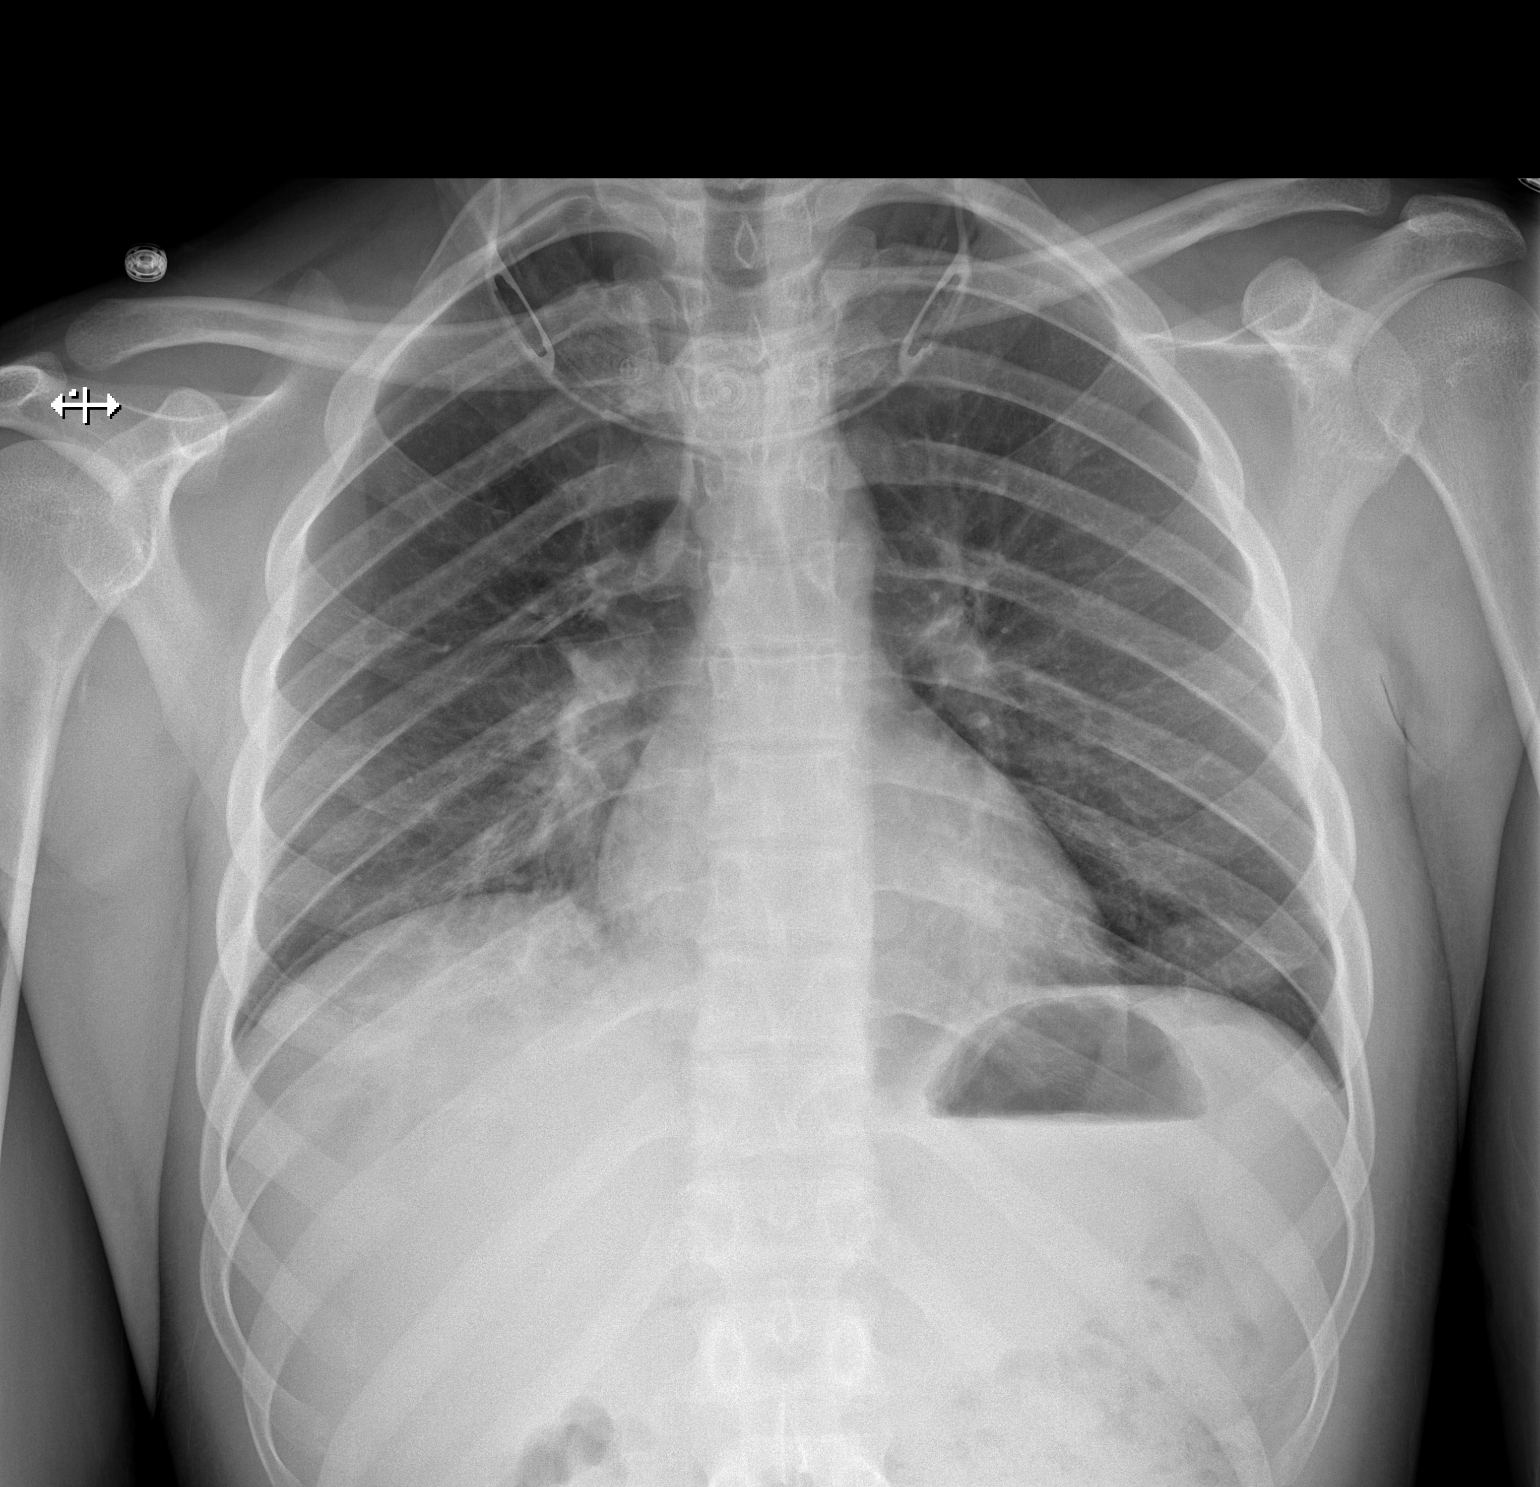

[w chest lat]
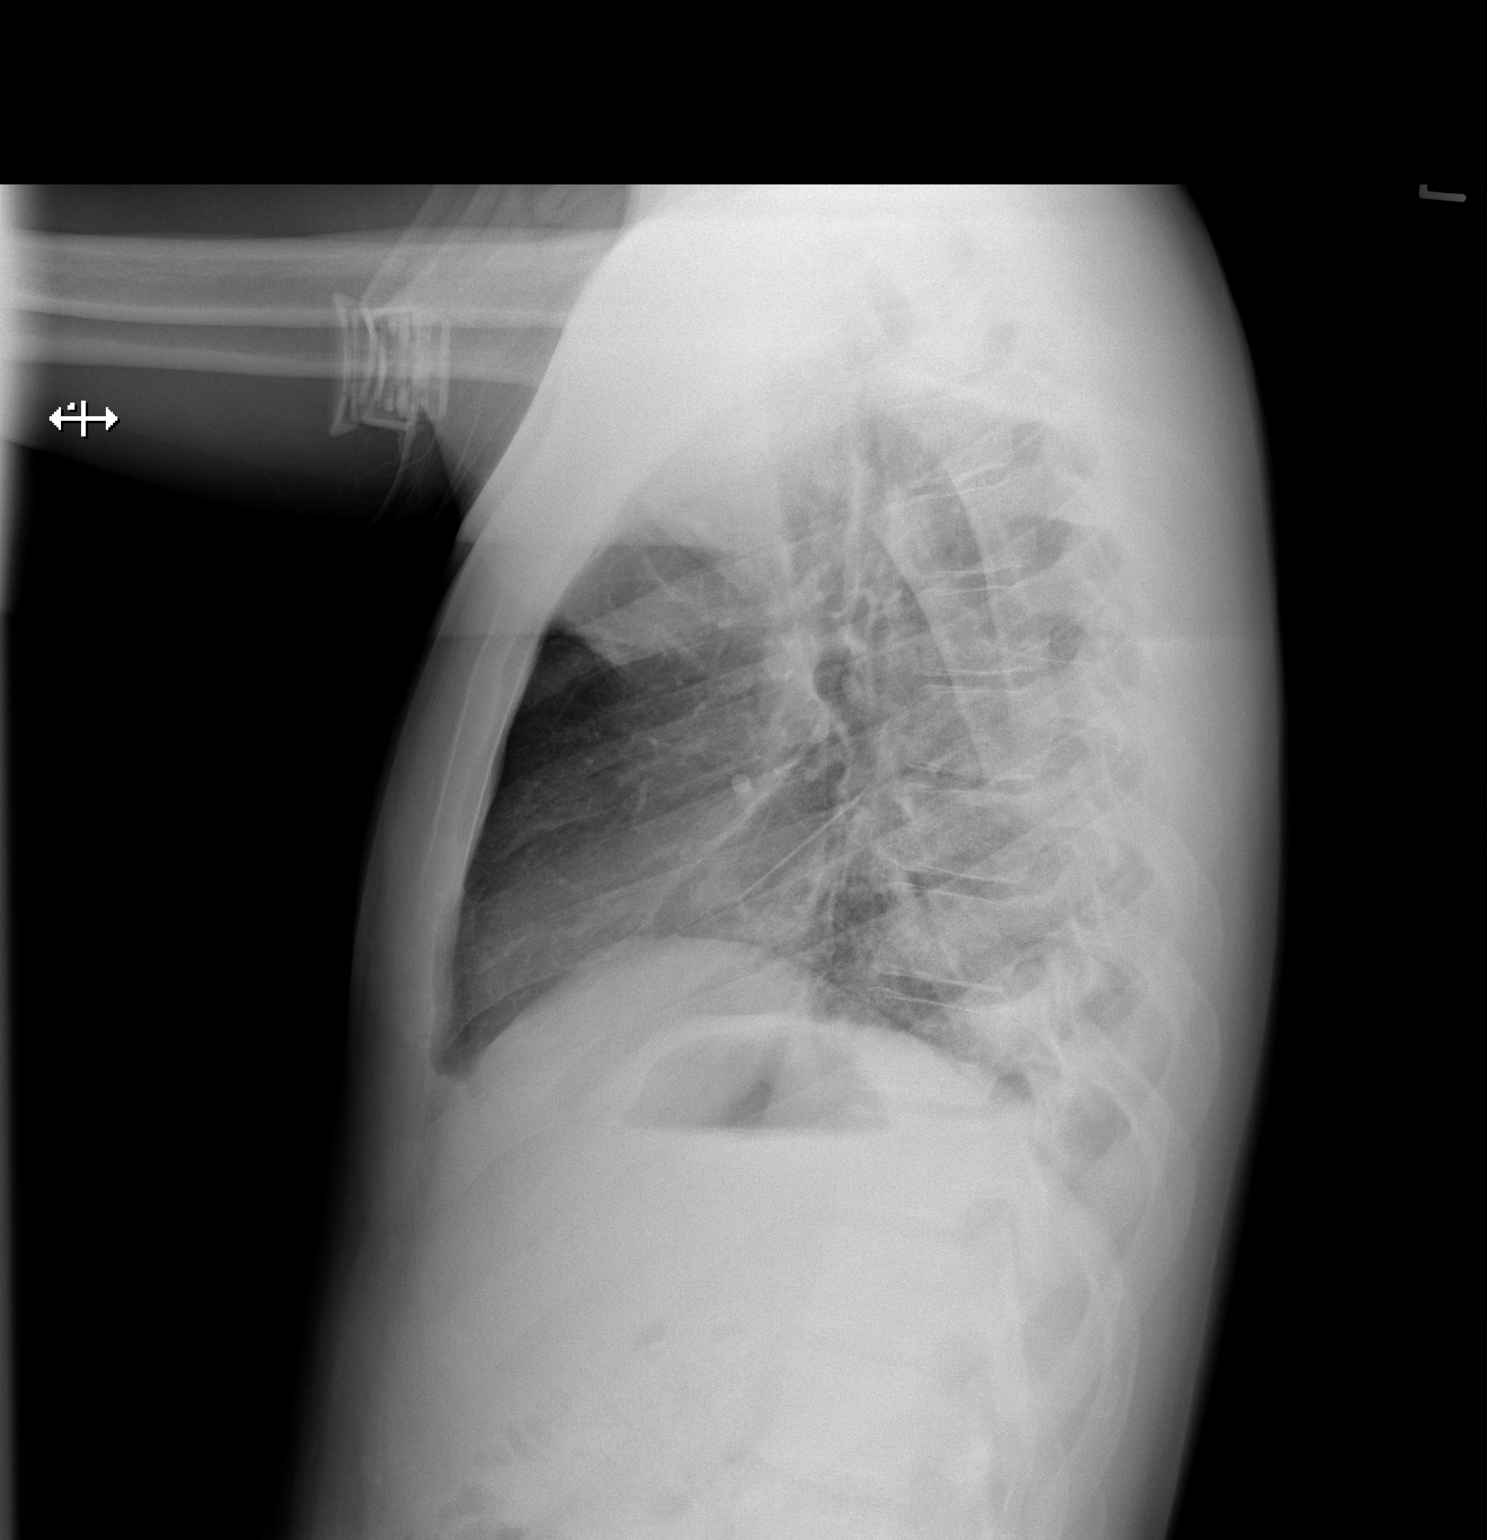

[2 of 2 positions shown; findings below may reference images not displayed]

FINDINGS: Cardiomediastinal silhouette is stable. Improvement in aeration.
Residual infiltrate/ pneumonia in right base medially. No new
infiltrate or pulmonary edema.
IMPRESSION: Improvement in aeration. Residual infiltrate/ pneumonia right base
medially with significant improvement from prior exam. No new
infiltrate or pulmonary edema.

## 2017-11-22 ENCOUNTER — Other Ambulatory Visit: Payer: Self-pay

## 2017-11-22 ENCOUNTER — Encounter (HOSPITAL_COMMUNITY): Payer: Self-pay | Admitting: Emergency Medicine

## 2017-11-22 ENCOUNTER — Emergency Department (HOSPITAL_COMMUNITY): Payer: Worker's Compensation

## 2017-11-22 ENCOUNTER — Emergency Department (HOSPITAL_COMMUNITY)
Admission: EM | Admit: 2017-11-22 | Discharge: 2017-11-23 | Disposition: A | Discharge status: Home / Self Care | Payer: Worker's Compensation | Attending: Emergency Medicine | Admitting: Emergency Medicine

## 2017-11-22 DIAGNOSIS — S0181XA Laceration without foreign body of other part of head, initial encounter: Secondary | ICD-10-CM

## 2017-11-22 DIAGNOSIS — Y929 Unspecified place or not applicable: Secondary | ICD-10-CM | POA: Insufficient documentation

## 2017-11-22 DIAGNOSIS — Y990 Civilian activity done for income or pay: Secondary | ICD-10-CM | POA: Diagnosis not present

## 2017-11-22 DIAGNOSIS — S01411A Laceration without foreign body of right cheek and temporomandibular area, initial encounter: Secondary | ICD-10-CM | POA: Insufficient documentation

## 2017-11-22 DIAGNOSIS — W208XXA Other cause of strike by thrown, projected or falling object, initial encounter: Secondary | ICD-10-CM | POA: Insufficient documentation

## 2017-11-22 DIAGNOSIS — Y9389 Activity, other specified: Secondary | ICD-10-CM | POA: Insufficient documentation

## 2017-11-22 DIAGNOSIS — Z23 Encounter for immunization: Secondary | ICD-10-CM | POA: Diagnosis not present

## 2017-11-22 DIAGNOSIS — Z79899 Other long term (current) drug therapy: Secondary | ICD-10-CM | POA: Diagnosis not present

## 2017-11-22 DIAGNOSIS — T1490XA Injury, unspecified, initial encounter: Secondary | ICD-10-CM

## 2017-11-22 MED ORDER — TETANUS-DIPHTH-ACELL PERTUSSIS 5-2.5-18.5 LF-MCG/0.5 IM SUSP
0.5000 mL | Freq: Once | INTRAMUSCULAR | Status: AC
Start: 2017-11-22 — End: 2017-11-22
  Administered 2017-11-22: 0.5 mL via INTRAMUSCULAR
  Filled 2017-11-22: qty 0.5

## 2017-11-22 MED ORDER — LIDOCAINE-EPINEPHRINE (PF) 2 %-1:200000 IJ SOLN
20.0000 mL | Freq: Once | INTRAMUSCULAR | Status: DC
  Filled 2017-11-22: qty 20
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
## 2017-11-22 NOTE — Discharge Instructions (Signed)
Laceration was repaired with 3 sutures. Follow-up for suture removal in 3-5 days. Keep the laceration clean with clean water and soap. Apply thin layer of antibiotic ointment at least twice daily. Monitor for signs of infection including redness, swelling, warmth, pus, fevers.
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
## 2017-11-23 NOTE — ED Notes (Signed)
Pt. Unable to sign due to system. Paperwork reviewed with pt.

## 2018-03-20 ENCOUNTER — Emergency Department (HOSPITAL_COMMUNITY): Payer: Medicaid Other

## 2018-03-20 ENCOUNTER — Emergency Department (HOSPITAL_COMMUNITY)
Admission: EM | Admit: 2018-03-20 | Discharge: 2018-03-20 | Disposition: A | Discharge status: Home / Self Care | Payer: Medicaid Other | Attending: Emergency Medicine | Admitting: Emergency Medicine

## 2018-03-20 ENCOUNTER — Encounter (HOSPITAL_COMMUNITY): Payer: Self-pay | Admitting: *Deleted

## 2018-03-20 DIAGNOSIS — X500XXA Overexertion from strenuous movement or load, initial encounter: Secondary | ICD-10-CM | POA: Insufficient documentation

## 2018-03-20 DIAGNOSIS — Y999 Unspecified external cause status: Secondary | ICD-10-CM | POA: Insufficient documentation

## 2018-03-20 DIAGNOSIS — S46811A Strain of other muscles, fascia and tendons at shoulder and upper arm level, right arm, initial encounter: Secondary | ICD-10-CM | POA: Insufficient documentation

## 2018-03-20 DIAGNOSIS — Y9389 Activity, other specified: Secondary | ICD-10-CM | POA: Insufficient documentation

## 2018-03-20 DIAGNOSIS — Z79899 Other long term (current) drug therapy: Secondary | ICD-10-CM | POA: Insufficient documentation

## 2018-03-20 DIAGNOSIS — Y929 Unspecified place or not applicable: Secondary | ICD-10-CM | POA: Insufficient documentation

## 2018-03-20 MED ORDER — CYCLOBENZAPRINE HCL 10 MG PO TABS: 10.0000 mg | ORAL_TABLET | Freq: Two times a day (BID) | ORAL | 0 refills | Status: DC | PRN

## 2018-03-20 MED ORDER — NAPROXEN 500 MG PO TABS: 500.0000 mg | ORAL_TABLET | Freq: Two times a day (BID) | ORAL | 0 refills | Status: DC
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
## 2020-10-05 ENCOUNTER — Other Ambulatory Visit: Payer: Self-pay

## 2020-10-05 ENCOUNTER — Emergency Department (HOSPITAL_COMMUNITY): Payer: Medicaid Other

## 2020-10-05 ENCOUNTER — Emergency Department (HOSPITAL_COMMUNITY)
Admission: EM | Admit: 2020-10-05 | Discharge: 2020-10-05 | Disposition: A | Discharge status: Home / Self Care | Payer: Medicaid Other | Attending: Emergency Medicine | Admitting: Emergency Medicine

## 2020-10-05 ENCOUNTER — Encounter (HOSPITAL_COMMUNITY): Payer: Self-pay | Admitting: Emergency Medicine

## 2020-10-05 DIAGNOSIS — Z5321 Procedure and treatment not carried out due to patient leaving prior to being seen by health care provider: Secondary | ICD-10-CM | POA: Insufficient documentation

## 2020-10-05 DIAGNOSIS — R059 Cough, unspecified: Secondary | ICD-10-CM | POA: Insufficient documentation

## 2020-10-05 NOTE — ED Triage Notes (Signed)
Patient reports cough x3 months. Denies other symptoms.

## 2021-10-07 ENCOUNTER — Other Ambulatory Visit: Payer: Self-pay

## 2021-10-07 ENCOUNTER — Encounter: Payer: Self-pay | Admitting: Emergency Medicine

## 2021-10-07 ENCOUNTER — Ambulatory Visit
Admission: EM | Admit: 2021-10-07 | Discharge: 2021-10-07 | Disposition: A | Discharge status: Home / Self Care | Payer: Medicaid Other | Attending: Emergency Medicine | Admitting: Emergency Medicine

## 2021-10-07 DIAGNOSIS — H6121 Impacted cerumen, right ear: Secondary | ICD-10-CM

## 2021-10-07 DIAGNOSIS — J309 Allergic rhinitis, unspecified: Secondary | ICD-10-CM

## 2021-10-07 DIAGNOSIS — H0100A Unspecified blepharitis right eye, upper and lower eyelids: Secondary | ICD-10-CM

## 2021-10-07 MED ORDER — FLUTICASONE PROPIONATE 50 MCG/ACT NA SUSP: 2.0000 | Freq: Every day | NASAL | 2 refills | Status: DC

## 2021-10-07 MED ORDER — LORATADINE 10 MG PO TABS: 10.0000 mg | ORAL_TABLET | Freq: Every day | ORAL | 2 refills | Status: DC

## 2021-10-07 MED ORDER — TOBRAMYCIN 0.3 % OP SOLN: 1.0000 [drp] | OPHTHALMIC | 0 refills | Status: DC

## 2021-10-07 NOTE — ED Triage Notes (Signed)
Pt reports right eye pain for a couple days. Had some white drainage this morning. Denies visual disturbances. Right ear clogged and can't hear as well since been using Q-tips to clean with.
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
## 2021-10-07 NOTE — Discharge Instructions (Signed)
Purchase baby shampoo (no more tears/tear-free) and wash your eyelid and eyelashes.  Use warm compresses with a washcloth several times a day to your right eye to help the infection heal.  Avoid using Q-tips in your ears.  If you feel like you have earwax, you can mix half warm water with half hydrogen peroxide and put a few drops in your ear, let it sit and work for a few minutes, and then let it drain out.  You can do this several times a week if needed to control earwax.  Use the nasal spray as prescribed.  I have also prescribed loratadine, generic for Claritin.  Your insurance may not cover it as it is over-the-counter, but some insurance will cover it.

## 2022-02-01 ENCOUNTER — Ambulatory Visit
Admission: EM | Admit: 2022-02-01 | Discharge: 2022-02-01 | Disposition: A | Discharge status: Home / Self Care | Payer: Self-pay | Attending: Emergency Medicine | Admitting: Emergency Medicine

## 2022-02-01 ENCOUNTER — Ambulatory Visit (INDEPENDENT_AMBULATORY_CARE_PROVIDER_SITE_OTHER): Payer: Self-pay

## 2022-02-01 ENCOUNTER — Encounter: Payer: Self-pay | Admitting: Emergency Medicine

## 2022-02-01 DIAGNOSIS — M542 Cervicalgia: Secondary | ICD-10-CM

## 2022-02-01 DIAGNOSIS — M5410 Radiculopathy, site unspecified: Secondary | ICD-10-CM

## 2022-02-01 DIAGNOSIS — M62838 Other muscle spasm: Secondary | ICD-10-CM

## 2022-02-01 MED ORDER — BACLOFEN 10 MG PO TABS: 10.0000 mg | ORAL_TABLET | Freq: Every day | ORAL | 0 refills | Status: AC

## 2022-02-01 MED ORDER — DICLOFENAC SODIUM 1 % EX GEL: 4.0000 g | Freq: Four times a day (QID) | CUTANEOUS | 2 refills | Status: AC

## 2022-02-01 MED ORDER — IBUPROFEN 600 MG PO TABS: 600.0000 mg | ORAL_TABLET | Freq: Three times a day (TID) | ORAL | 0 refills | Status: DC | PRN

## 2022-02-01 NOTE — ED Triage Notes (Addendum)
Patient c/o LFT shoulder pain x 3 days.  ? ?Patient denies any fall or trauma.  ? ?Patient endorses pain happened upon waking. Patient states pain worsens when turning neck and with lifting arm up.  ? ?Patient hasn't taken any medications for symptoms.  ? ?Patient endorses needing work note.  ? ?History of Spinal Cord Injury (2017).  ?
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
## 2022-02-01 NOTE — Discharge Instructions (Addendum)
I am happy to inform you that the x-ray of your neck is completely normal and that the numbness that you felt in your hand upon waking in the morning along with the tension and soreness in your neck is more than likely related to the heavy lifting you do at work. ? ?To provide you with relief of your pain, I recommend that you apply topical Voltaren gel 4 times daily as needed.  Have also provided you with a prescription for ibuprofen 600 mg tablets that you can take 3 times daily as needed.  Finally, a muscle relaxer at bedtime is very helpful for tense muscles and helping to get a good night sleep. ? ?If you continue to have pain in your left shoulder that you feel is worsening instead of improving or you notice that you are losing full range of motion of your shoulder (which you did demonstrate well today), consider going to [facility] for further evaluation.  We provided you with the contact information for the clinic.  You do not need to make an appointment for their urgent care clinic,  they see walk-in patients only. ? ?I provided you with a note to return to work on Friday.  If you find that you are feeling better and would like to return sooner, please contact the clinic and we will be happy to write you another 1. ? ?Thank you for visiting urgent care today. ?

## 2022-09-18 ENCOUNTER — Emergency Department (HOSPITAL_BASED_OUTPATIENT_CLINIC_OR_DEPARTMENT_OTHER)
Admission: EM | Admit: 2022-09-18 | Discharge: 2022-09-18 | Disposition: A | Discharge status: Home / Self Care | Payer: Medicaid Other | Attending: Emergency Medicine | Admitting: Emergency Medicine

## 2022-09-18 ENCOUNTER — Emergency Department (HOSPITAL_BASED_OUTPATIENT_CLINIC_OR_DEPARTMENT_OTHER): Payer: Medicaid Other

## 2022-09-18 ENCOUNTER — Other Ambulatory Visit: Payer: Self-pay

## 2022-09-18 ENCOUNTER — Encounter (HOSPITAL_BASED_OUTPATIENT_CLINIC_OR_DEPARTMENT_OTHER): Payer: Self-pay | Admitting: Emergency Medicine

## 2022-09-18 DIAGNOSIS — Z1152 Encounter for screening for COVID-19: Secondary | ICD-10-CM | POA: Insufficient documentation

## 2022-09-18 DIAGNOSIS — J101 Influenza due to other identified influenza virus with other respiratory manifestations: Secondary | ICD-10-CM | POA: Diagnosis not present

## 2022-09-18 DIAGNOSIS — R509 Fever, unspecified: Secondary | ICD-10-CM | POA: Diagnosis present

## 2022-09-18 LAB — RESP PANEL BY RT-PCR (RSV, FLU A&B, COVID)  RVPGX2
Influenza A by PCR: POSITIVE — AB
Influenza B by PCR: NEGATIVE
Resp Syncytial Virus by PCR: NEGATIVE
SARS Coronavirus 2 by RT PCR: NEGATIVE

## 2022-09-18 MED ORDER — KETOROLAC TROMETHAMINE 15 MG/ML IJ SOLN
30.0000 mg | Freq: Once | INTRAMUSCULAR | Status: AC
  Administered 2022-09-18: 30 mg via INTRAMUSCULAR
  Filled 2022-09-18: qty 2

## 2022-09-18 MED ORDER — NAPROXEN 500 MG PO TABS: 500.0000 mg | ORAL_TABLET | Freq: Two times a day (BID) | ORAL | 0 refills | Status: DC

## 2022-09-18 MED ORDER — BENZONATATE 100 MG PO CAPS: 100.0000 mg | ORAL_CAPSULE | Freq: Three times a day (TID) | ORAL | 0 refills | Status: DC

## 2022-09-18 MED ORDER — ONDANSETRON 4 MG PO TBDP: 4.0000 mg | ORAL_TABLET | Freq: Three times a day (TID) | ORAL | 0 refills | Status: DC | PRN

## 2022-09-18 NOTE — Discharge Instructions (Addendum)
Please read and follow all provided instructions.  Your diagnoses today include:  1. Influenza A     Tests performed today include: Flu test was positive Vital signs. See below for your results today.   Medications prescribed:  Tessalon Perles - cough suppressant medication  Zofran (ondansetron) - for nausea and vomiting  Naproxen - anti-inflammatory pain medication Do not exceed 500mg  naproxen every 12 hours, take with food  You have been prescribed an anti-inflammatory medication or NSAID. Take with food. Take smallest effective dose for the shortest duration needed for your pain. Stop taking if you experience stomach pain or vomiting.   Take any prescribed medications only as directed.  Home care instructions:  Follow any educational materials contained in this packet. Please continue drinking plenty of fluids. Use over-the-counter cold and flu medications as needed as directed on packaging for symptom relief. You may also use ibuprofen or tylenol as directed on packaging for pain or fever.   BE VERY CAREFUL not to take multiple medicines containing Tylenol (also called acetaminophen). Doing so can lead to an overdose which can damage your liver and cause liver failure and possibly death.   Follow-up instructions: Please follow-up with your primary care provider in the next 3 days for further evaluation of your symptoms.   Return instructions:  Please return to the Emergency Department if you experience worsening symptoms. Please return if you have a high fever greater than 101 degrees not controlled with over-the-counter medications, persistent vomiting and cannot keep down fluids, or worsening trouble breathing. Please return if you have any other emergent concerns.  Additional Information:  Your vital signs today were: BP 111/60 (BP Location: Right Arm)   Pulse 76   Temp 99.3 F (37.4 C)   Resp 18   Ht 5\' 9"  (1.753 m)   Wt 113.4 kg   SpO2 100%   BMI 36.92 kg/m   If your blood pressure (BP) was elevated above 135/85 this visit, please have this repeated by your doctor within one month.

## 2022-09-18 NOTE — ED Provider Notes (Signed)
[facility] EMERGENCY DEPT Provider Note   CSN: [id number] Arrival date & time: 09/18/22  1048     History  Chief Complaint  Patient presents with   Cough   Headache    James [name] is a 26 y.o. male.  Patient presents to the emergency department today for evaluation of fever, chills, cough, and headache over the past 2 days.  Headache is generalized.  He states that he does not typically get headaches.  He has had emesis, both posttussive and otherwise over the past couple of days.  No confusion.  No weakness, numbness, or tingling in the arms of the legs.  No chest pain or abdominal pain.  The cough is worse, but he states has been present for at least 1 year.  Patient denies history of significant GERD, postnasal drip/allergies, asthma.  Unknown sick contacts.       Home Medications Prior to Admission medications   Medication Sig Start Date End Date Taking? Authorizing Provider  diclofenac Sodium (VOLTAREN) 1 % GEL Apply 4 g topically 4 (four) times daily. Apply to affected areas 4 times daily as needed for pain. 02/01/22   Theadora Rama Scales, PA-C  ibuprofen (ADVIL) 600 MG tablet Take 1 tablet (600 mg total) by mouth every 8 (eight) hours as needed for up to 30 doses for fever, headache, mild pain or moderate pain (Inflammation). Take 1 tablet 3 times daily as needed for inflammation of upper airways and/or pain. 02/01/22   Theadora Rama Scales, PA-C      Allergies    No known allergies    Review of Systems   Review of Systems  Physical Exam Updated Vital Signs BP 111/60 (BP Location: Right Arm)   Pulse 76   Temp 99.3 F (37.4 C)   Resp 18   Ht 5\' 9"  (1.753 m)   Wt 113.4 kg   SpO2 100%   BMI 36.92 kg/m   Physical Exam Vitals and nursing note reviewed.  Constitutional:      Appearance: He is well-developed.  HENT:     Head: Normocephalic and atraumatic.     Jaw: No trismus.     Right Ear: Tympanic membrane, ear canal and external ear  normal.     Left Ear: Tympanic membrane, ear canal and external ear normal.     Nose: Nose normal. No mucosal edema or rhinorrhea.     Mouth/Throat:     Mouth: Mucous membranes are not dry.     Pharynx: Uvula midline. No oropharyngeal exudate, posterior oropharyngeal erythema or uvula swelling.     Tonsils: No tonsillar abscesses.  Eyes:     General:        Right eye: No discharge.        Left eye: No discharge.     Conjunctiva/sclera: Conjunctivae normal.  Neck:     Comments: Full range of motion, no meningeal signs. Cardiovascular:     Rate and Rhythm: Normal rate and regular rhythm.     Heart sounds: Normal heart sounds.  Pulmonary:     Effort: Pulmonary effort is normal. No respiratory distress.     Breath sounds: Normal breath sounds. No wheezing or rales.     Comments: Occasional cough during exam.  Lungs clear to auscultation bilaterally.  No respiratory distress or accessory muscle use. Abdominal:     Palpations: Abdomen is soft.     Tenderness: There is no abdominal tenderness.     Comments: Abdomen soft and nontender.  Musculoskeletal:  Cervical back: Normal range of motion and neck supple.  Skin:    General: Skin is warm and dry.  Neurological:     Mental Status: He is alert.     ED Results / Procedures / Treatments   Labs (all labs ordered are listed, but only abnormal results are displayed) Labs Reviewed  RESP PANEL BY RT-PCR (RSV, FLU A&B, COVID)  RVPGX2 - Abnormal; Notable for the following components:      Result Value   Influenza A by PCR POSITIVE (*)    All other components within normal limits    EKG None  Radiology DG Chest Port 1 View  Result Date: 09/18/2022 CLINICAL DATA:  Cough for 2 days EXAM: PORTABLE CHEST 1 VIEW COMPARISON:  Chest x-ray May 12, 2016 FINDINGS: The cardiomediastinal silhouette is unchanged in contour. No focal pulmonary opacity. No pleural effusion or pneumothorax. The visualized upper abdomen is unremarkable. No acute  osseous abnormality. IMPRESSION: No acute cardiopulmonary abnormality. Electronically Signed   By: Jacob Moores M.D.   On: 09/18/2022 13:23    Procedures Procedures    Medications Ordered in ED Medications - No data to display  ED Course/ Medical Decision Making/ A&P    Patient seen and examined. History obtained directly from patient. Work-up including labs, imaging, EKG ordered in triage, if performed, were reviewed.    Labs/EKG: Independently reviewed and interpreted.  This included: Respiratory panel, flu positive, negative COVID.  Imaging: Due to cough for 1 year, chest x-ray ordered and pending.  Medications/Fluids: Ordered: IM Toradol for headache  Most recent vital signs reviewed and are as follows: BP 111/60 (BP Location: Right Arm)   Pulse 76   Temp 99.3 F (37.4 C)   Resp 18   Ht 5\' 9"  (1.753 m)   Wt 113.4 kg   SpO2 100%   BMI 36.92 kg/m   Initial impression: Influenza-like illness, chronic cough.  2:41 PM Reassessment performed. Patient appears stable. No neurologic decompensation. Looks well overall.   Imaging personally visualized and interpreted including: Chest x-ray, agree negative.  Reviewed pertinent lab work and imaging with patient at bedside. Questions answered.   Most current vital signs reviewed and are as follows: BP 111/60 (BP Location: Right Arm)   Pulse 76   Temp 99.3 F (37.4 C)   Resp 18   Ht 5\' 9"  (1.753 m)   Wt 113.4 kg   SpO2 100%   BMI 36.92 kg/m   Plan: Discharge to home.   Prescriptions written for: Tessalon, Zofran, naproxen  Other home care instructions discussed: Rest, hydration  ED return instructions discussed: Worsening headache, confusion, persistent vomiting, difficulty breathing, new or worsening symptoms.  Follow-up instructions discussed: Patient encouraged to follow-up with their PCP in 5 days if not feeling better.  Encouraged follow-up when able for evaluation of chronic cough.                           Medical Decision Making Amount and/or Complexity of Data Reviewed Radiology: ordered.  Risk Prescription drug management.   Patient with symptoms consistent with influenza. Vitals are stable, low-grade fever. No signs of dehydration, tolerating PO's. Lungs are clear. No PNA on CXR. No hypoxia.  Recommended PCP follow-up for evaluation of chronic cough.  In regards to the patient's headache, critical differentials were considered including subarachnoid hemorrhage, intracerebral hemorrhage, epidural/subdural hematoma, pituitary apoplexy, vertebral/carotid artery dissection, giant cell arteritis, central venous thrombosis, reversible cerebral vasoconstriction, acute angle closure glaucoma,  idiopathic intracranial hypertension, bacterial meningitis, viral encephalitis, carbon monoxide poisoning, posterior reversible encephalopathy syndrome, pre-eclampsia.   Reg flag symptoms related to these causes were considered including systemic symptoms (fever, weight loss), neurologic symptoms (confusion, mental status change, vision change, associated seizure), acute or sudden "thunderclap" onset, patient age 32 or older with new or progressive headache, patient of any age with first headache or change in headache pattern, pregnant or postpartum status, history of HIV or other immunocompromise, history of cancer, headache occurring with exertion, associated neck or shoulder pain, associated traumatic injury, concurrent use of anticoagulation, family history of spontaneous SAH, and concurrent drug use.    Other benign, more common causes of headache were considered including migraine, tension-type headache, cluster headache, referred pain from other cause such as sinus infection, dental pain, trigeminal neuralgia.   Patient has tested positive for the flu and I suspect this is the most likely etiology at this time.   On exam, patient has a reassuring neuro exam including baseline mental status, no significant  neck pain or meningeal signs, no signs of severe infection.    The patient's vital signs, pertinent lab work and imaging were reviewed and interpreted as discussed in the ED course. Hospitalization was considered for further testing, treatments, or serial exams/observation. However as patient is well-appearing, has a stable exam over the course of their evaluation, and reassuring studies today, I do not feel that they warrant admission at this time. This plan was discussed with the patient who verbalizes agreement and comfort with this plan and seems reliable and able to return to the Emergency Department with worsening or changing symptoms.          Final Clinical Impression(s) / ED Diagnoses Final diagnoses:  Influenza A    Rx / DC Orders ED Discharge Orders          Ordered    benzonatate (TESSALON) 100 MG capsule  Every 8 hours        09/18/22 1441    naproxen (NAPROSYN) 500 MG tablet  2 times daily        09/18/22 1441    ondansetron (ZOFRAN-ODT) 4 MG disintegrating tablet  Every 8 hours PRN        09/18/22 1441              Renne Crigler, PA-C 09/18/22 1442    Arby Barrette, MD 09/20/22 1458

## 2022-09-18 NOTE — ED Notes (Signed)
Patient verbalizes understanding of discharge instructions. Opportunity for questioning and answers were provided. Patient discharged from ED.  °

## 2022-09-18 NOTE — ED Triage Notes (Signed)
Pt arrives to ED with c/o cough, headache, intermittent SOB.

## 2023-08-09 ENCOUNTER — Ambulatory Visit: Payer: 59

## 2023-08-09 ENCOUNTER — Ambulatory Visit
Admission: EM | Admit: 2023-08-09 | Discharge: 2023-08-09 | Disposition: A | Discharge status: Home / Self Care | Payer: 59 | Attending: Internal Medicine | Admitting: Internal Medicine

## 2023-08-09 DIAGNOSIS — J069 Acute upper respiratory infection, unspecified: Secondary | ICD-10-CM

## 2023-08-09 DIAGNOSIS — R053 Chronic cough: Secondary | ICD-10-CM

## 2023-08-09 DIAGNOSIS — R051 Acute cough: Secondary | ICD-10-CM | POA: Diagnosis not present

## 2023-08-09 MED ORDER — PROMETHAZINE-DM 6.25-15 MG/5ML PO SYRP: 5.0000 mL | ORAL_SOLUTION | Freq: Three times a day (TID) | ORAL | 0 refills | Status: DC | PRN

## 2023-08-09 MED ORDER — AMOXICILLIN-POT CLAVULANATE 875-125 MG PO TABS: 1.0000 | ORAL_TABLET | Freq: Two times a day (BID) | ORAL | 0 refills | Status: DC

## 2023-08-09 MED ORDER — IBUPROFEN 800 MG PO TABS
800.0000 mg | ORAL_TABLET | Freq: Once | ORAL | Status: AC
  Administered 2023-08-09: 800 mg via ORAL

## 2023-08-09 MED ORDER — CETIRIZINE HCL 10 MG PO TABS: 10.0000 mg | ORAL_TABLET | Freq: Every day | ORAL | 0 refills | Status: AC

## 2023-08-09 MED ORDER — CETIRIZINE HCL 10 MG PO TABS: 10.0000 mg | ORAL_TABLET | Freq: Every day | ORAL | 0 refills | Status: DC

## 2023-08-09 NOTE — ED Triage Notes (Addendum)
Pt c/o body aches, neck pain when turns head side to side, sore throat x 1 week-denies known fever-no pain meds PTA-also c/o dry cough x 1+year-NAD-steady gait

## 2023-08-09 NOTE — Discharge Instructions (Addendum)
We will manage this as an upper respiratory infection with Augmentin. For sore throat or cough try using a honey-based tea. Use 3 teaspoons of honey with juice squeezed from half lemon. Place shaved pieces of ginger into 1/2-1 cup of water and warm over stove top. Then mix the ingredients and repeat every 4 hours as needed. Please take ibuprofen 600mg  every 6 hours with food alternating with OR taken together with Tylenol 650mg  every 6 hours for throat pain, fevers, aches and pains. Hydrate very well with at least 2 liters of water. Eat light meals such as soups (chicken and noodles, vegetable, chicken and wild rice).  Do not eat foods that you are allergic to.  Taking an antihistamine like Zyrtec can help against postnasal drainage, sinus congestion which can cause sinus pain, sinus headaches, throat pain, painful swallowing, coughing.  You can take this together with cough medication as needed.   I will let you know about your chest x-ray results later.  If your symptoms worsen despite the antibiotics then please go to the emergency room.
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
## 2024-07-27 ENCOUNTER — Other Ambulatory Visit: Payer: Self-pay

## 2024-07-27 ENCOUNTER — Encounter (HOSPITAL_BASED_OUTPATIENT_CLINIC_OR_DEPARTMENT_OTHER): Payer: Self-pay | Admitting: Emergency Medicine

## 2024-07-27 ENCOUNTER — Emergency Department (HOSPITAL_BASED_OUTPATIENT_CLINIC_OR_DEPARTMENT_OTHER): Admitting: Radiology

## 2024-07-27 ENCOUNTER — Emergency Department (HOSPITAL_BASED_OUTPATIENT_CLINIC_OR_DEPARTMENT_OTHER)
Admission: EM | Admit: 2024-07-27 | Discharge: 2024-07-27 | Disposition: A | Discharge status: Home / Self Care | Attending: Emergency Medicine | Admitting: Emergency Medicine

## 2024-07-27 DIAGNOSIS — M25511 Pain in right shoulder: Secondary | ICD-10-CM | POA: Insufficient documentation

## 2024-07-27 MED ORDER — IBUPROFEN 600 MG PO TABS: 600.0000 mg | ORAL_TABLET | Freq: Four times a day (QID) | ORAL | 0 refills | Status: AC | PRN

## 2024-07-27 MED ORDER — KETOROLAC TROMETHAMINE 30 MG/ML IJ SOLN
30.0000 mg | Freq: Once | INTRAMUSCULAR | Status: AC
  Administered 2024-07-27: 30 mg via INTRAMUSCULAR
  Filled 2024-07-27: qty 1

## 2024-07-27 MED ORDER — CYCLOBENZAPRINE HCL 10 MG PO TABS: 10.0000 mg | ORAL_TABLET | Freq: Two times a day (BID) | ORAL | 0 refills | Status: AC | PRN

## 2024-07-27 NOTE — Discharge Instructions (Addendum)
 The x-ray looks normal today.  Try to avoid any heavy lifting.  Take the anti-inflammatory muscle relaxer to see if that helps with the pain but you can also apply a heating pad to the shoulder and the neck area.

## 2024-07-27 NOTE — ED Triage Notes (Signed)
 R shoulder pain x 2 weeks. Worse with movement. Denies injury.
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
## 2024-08-02 ENCOUNTER — Encounter (HOSPITAL_BASED_OUTPATIENT_CLINIC_OR_DEPARTMENT_OTHER): Payer: Self-pay

## 2024-08-02 ENCOUNTER — Other Ambulatory Visit: Payer: Self-pay

## 2024-08-02 ENCOUNTER — Emergency Department (HOSPITAL_BASED_OUTPATIENT_CLINIC_OR_DEPARTMENT_OTHER)
Admission: EM | Admit: 2024-08-02 | Discharge: 2024-08-02 | Disposition: A | Discharge status: Home / Self Care | Attending: Emergency Medicine | Admitting: Emergency Medicine

## 2024-08-02 ENCOUNTER — Ambulatory Visit: Payer: Self-pay | Admitting: *Deleted

## 2024-08-02 DIAGNOSIS — M542 Cervicalgia: Secondary | ICD-10-CM | POA: Diagnosis not present

## 2024-08-02 DIAGNOSIS — S46911A Strain of unspecified muscle, fascia and tendon at shoulder and upper arm level, right arm, initial encounter: Secondary | ICD-10-CM | POA: Diagnosis not present

## 2024-08-02 DIAGNOSIS — S4991XA Unspecified injury of right shoulder and upper arm, initial encounter: Secondary | ICD-10-CM | POA: Diagnosis present

## 2024-08-02 DIAGNOSIS — M25511 Pain in right shoulder: Secondary | ICD-10-CM

## 2024-08-02 DIAGNOSIS — T148XXA Other injury of unspecified body region, initial encounter: Secondary | ICD-10-CM

## 2024-08-02 DIAGNOSIS — X58XXXA Exposure to other specified factors, initial encounter: Secondary | ICD-10-CM | POA: Insufficient documentation

## 2024-08-02 MED ORDER — ACETAMINOPHEN 500 MG PO TABS
1000.0000 mg | ORAL_TABLET | Freq: Once | ORAL | Status: AC
  Administered 2024-08-02: 1000 mg via ORAL
  Filled 2024-08-02: qty 2

## 2024-08-02 MED ORDER — METHOCARBAMOL 500 MG PO TABS: 500.0000 mg | ORAL_TABLET | Freq: Two times a day (BID) | ORAL | 0 refills | Status: AC

## 2024-08-02 MED ORDER — GABAPENTIN 100 MG PO CAPS: 100.0000 mg | ORAL_CAPSULE | Freq: Every day | ORAL | 0 refills | Status: DC

## 2024-08-02 MED ORDER — LIDOCAINE 5 % EX PTCH
2.0000 | MEDICATED_PATCH | Freq: Once | CUTANEOUS | Status: DC
  Administered 2024-08-02: 2 via TRANSDERMAL
  Filled 2024-08-02: qty 2
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
## 2024-08-02 NOTE — Discharge Instructions (Signed)
 James [name]  Thank you for allowing us  to take care of you today.  You came to the Emergency Department today because you have had continued right shoulder pain.  You are tender to palpation along the muscle your neck, you do not have any weakness or symptoms that correspond to any particular nerve distribution.  You do need to keep your follow-up with your neurosurgeon.  Given you have not had adequate pain relief with your current regimen of ibuprofen  and Flexeril , we are prescribing a different muscle relaxer called Robaxin .  Please discontinue the Flexeril , do not take both Robaxin  and Flexeril  at the same time.  Please do not drive after taking Robaxin  as it can make you sleepy.  We also are prescribing you a pain medication called gabapentin  that you can take nightly before bed, this medication can also make you sleepy, so do not take it before driving, work, operating heavy machinery, etc.  Additionally we recommend using Tylenol  every 4-6 hours as needed in combination with the ibuprofen  that you using every 4-6 hours as needed.   To-Do: 1. Please follow-up with your primary doctor within 1 - 2 weeks / as soon as possible.   Please return to the Emergency Department or call 911 if you experience have worsening of your symptoms, or do not get better, weakness, numbness, chest pain, shortness of breath, severe or significantly worsening pain, high fever, severe confusion, pass out or have any reason to think that you need emergency medical care.   We hope you feel better soon.   Mitzie Later, MD Department of Emergency Medicine [facility]
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
## 2024-08-02 NOTE — ED Triage Notes (Signed)
 Shoulder pain, chronic. Seen here prior for it. Has an appointment on 11/5 with neurologist. Says he needs something to manage the pain until then.

## 2024-08-08 ENCOUNTER — Ambulatory Visit: Admitting: Orthopedic Surgery

## 2024-08-08 ENCOUNTER — Other Ambulatory Visit (INDEPENDENT_AMBULATORY_CARE_PROVIDER_SITE_OTHER): Payer: Self-pay

## 2024-08-08 DIAGNOSIS — M5412 Radiculopathy, cervical region: Secondary | ICD-10-CM | POA: Diagnosis not present

## 2024-08-08 DIAGNOSIS — M25511 Pain in right shoulder: Secondary | ICD-10-CM | POA: Diagnosis not present

## 2024-08-08 DIAGNOSIS — M542 Cervicalgia: Secondary | ICD-10-CM

## 2024-08-08 MED ORDER — PREGABALIN 75 MG PO CAPS: 75.0000 mg | ORAL_CAPSULE | Freq: Two times a day (BID) | ORAL | 1 refills | Status: AC

## 2024-08-08 MED ORDER — METHYLPREDNISOLONE 4 MG PO TBPK: ORAL_TABLET | ORAL | 0 refills | Status: AC

## 2024-08-08 NOTE — Progress Notes (Signed)
 Orthopedic Surgery Progress Note   Assessment: Patient is a 28 y.o. male with right shoulder and lateral arm pain that seems consistent with cervical radiculopathy   Plan: -Explained that most of these pain tends to get better with non-operative treatment so will start with conservative treatments -Prescribed a medrol dose pak and lyrica . Referred him to physical therapy -If he is not doing any better at our next visit, will get a MRI to evaluate further -Patient should return to the office in 7-8 weeks, x-rays at next visit: none  ___________________________________________________________________________  History: Patient has noticed about 2 weeks of pain in his right shoulder and right lateral arm. There was no trauma or injury that preceded the onset of the pain. He has no left arm pain. He notes the pain with neck motion particularly turning to the right or flexion at the neck. He has tried using a muscle relaxer but it has not been helping. He describes the pain as a shocking and shooting type pain. No trouble with fine motor skills in the hands. No bowel or bladder incontinence. No saddle anesthesia.    Physical Exam:  General: no acute distress, appears stated age Neurologic: alert, answering questions appropriately, following commands Respiratory: unlabored breathing on room air, symmetric chest rise Psychiatric: appropriate affect, normal cadence to speech  MSK:   -Right shoulder exam:   No pain through range of motion, negative jobe, negative belly press, no weakness with external rotation with arm at side  AIN/PIN/IO intact  Palpable radial pulse  Sensation intact to light touch in median/ulnar/radial/axillary nerve distributions  Hand warm and well perfused   5/5 strength in all upper extremity myotomes, sensation intact to light touch in C5-T1 nerve distributions bilaterally  -Positive spurling on the right, negative on the left -Positive shoulder abduction relief  sign -Negative grip and release -Negative romberg -No beats of clonus bilaterally -Negative hoffman bilaterally  Imaging: XRs of the right shoulder from 07/27/2024 were independently reviewed and interpreted, showing no significant degenerative changes. No fracture or dislocation seen.   XRs of the cervical spine from 08/08/2024 were independently reviewed and interpreted, showing disc height loss at C6/7. No other significant degenerative changes seen. No evidence of instability on flexion/extension views. No fracture or dislocation seen.    Patient name: James [name] Patient MRN: [id number] Date: 08/08/24

## 2024-08-10 ENCOUNTER — Ambulatory Visit: Admitting: Sports Medicine

## 2024-08-10 VITALS — BP 136/84 | HR 87 | Temp 98.2°F | Ht 70.0 in | Wt 247.4 lb

## 2024-08-10 DIAGNOSIS — D649 Anemia, unspecified: Secondary | ICD-10-CM | POA: Diagnosis not present

## 2024-08-10 DIAGNOSIS — Z131 Encounter for screening for diabetes mellitus: Secondary | ICD-10-CM | POA: Diagnosis not present

## 2024-08-10 DIAGNOSIS — M5412 Radiculopathy, cervical region: Secondary | ICD-10-CM

## 2024-08-10 DIAGNOSIS — H6121 Impacted cerumen, right ear: Secondary | ICD-10-CM

## 2024-08-10 DIAGNOSIS — Z6835 Body mass index (BMI) 35.0-35.9, adult: Secondary | ICD-10-CM

## 2024-08-10 DIAGNOSIS — E66812 Obesity, class 2: Secondary | ICD-10-CM | POA: Diagnosis not present

## 2024-08-10 DIAGNOSIS — Z113 Encounter for screening for infections with a predominantly sexual mode of transmission: Secondary | ICD-10-CM

## 2024-08-10 LAB — COMPREHENSIVE METABOLIC PANEL WITH GFR
ALT: 43 U/L (ref 0–53)
AST: 23 U/L (ref 0–37)
Albumin: 4.7 g/dL (ref 3.5–5.2)
Alkaline Phosphatase: 70 U/L (ref 39–117)
BUN: 13 mg/dL (ref 6–23)
CO2: 27 meq/L (ref 19–32)
Calcium: 9.6 mg/dL (ref 8.4–10.5)
Chloride: 100 meq/L (ref 96–112)
Creatinine, Ser: 1.01 mg/dL (ref 0.40–1.50)
GFR: 101.12 mL/min (ref >60.00)
Glucose, Bld: 91 mg/dL (ref 70–99)
Potassium: 4.3 meq/L (ref 3.5–5.1)
Sodium: 138 meq/L (ref 135–145)
Total Bilirubin: 1.2 mg/dL (ref 0.2–1.2)
Total Protein: 7.8 g/dL (ref 6.0–8.3)

## 2024-08-10 LAB — LIPID PANEL
Cholesterol: 195 mg/dL (ref 0–200)
HDL: 49.4 mg/dL (ref >39.00)
LDL Cholesterol: 124 mg/dL — ABNORMAL HIGH (ref 0–99)
NonHDL: 146.04
Total CHOL/HDL Ratio: 4
Triglycerides: 110 mg/dL (ref 0.0–149.0)
VLDL: 22 mg/dL (ref 0.0–40.0)

## 2024-08-10 LAB — TSH: TSH: 2.02 u[IU]/mL (ref 0.35–5.50)

## 2024-08-10 LAB — HEMOGLOBIN A1C: Hgb A1c MFr Bld: 5.4 % (ref 4.6–6.5)

## 2024-08-10 LAB — CBC
HCT: 46.4 % (ref 39.0–52.0)
Hemoglobin: 15.9 g/dL (ref 13.0–17.0)
MCHC: 34.3 g/dL (ref 30.0–36.0)
MCV: 90 fl (ref 78.0–100.0)
Platelets: 301 K/uL (ref 150.0–400.0)
RBC: 5.16 Mil/uL (ref 4.22–5.81)
RDW: 12.7 % (ref 11.5–15.5)
WBC: 8.5 K/uL (ref 4.0–10.5)

## 2024-08-10 NOTE — Patient Instructions (Addendum)
 Recommend 5 fruits and vegetables daily Water  64 ounces daily Recommend keeping up-to-date on vaccinations Continue routine follow-up visits with primary care     If labs were collected or images ordered, we will inform you of results once we have received and reviewed them. We will contact you either by fpl group, or telephone call.  If a referral to a specialist was entered for you, please call us  in 2 weeks if you have not heard from the specialist office to schedule.   Thank you for choosing us  for your care. Wishing you a healthy and peaceful winter season. Stay well and we look forward to seeing you at your next visit.Recommend exercise 3 to 5 days/week 30 minutes
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
## 2024-08-11 LAB — HEPATITIS C ANTIBODY: Hepatitis C Ab: NONREACTIVE

## 2024-08-11 LAB — HIV ANTIBODY (ROUTINE TESTING W REFLEX)
HIV 1&2 Ab, 4th Generation: NONREACTIVE
HIV FINAL INTERPRETATION: NEGATIVE

## 2024-08-13 ENCOUNTER — Ambulatory Visit: Payer: Self-pay | Admitting: Sports Medicine

## 2024-08-13 NOTE — Progress Notes (Signed)
 Results given in mychart.
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
## 2024-08-22 ENCOUNTER — Encounter: Payer: Self-pay | Admitting: Physical Therapy

## 2024-08-22 ENCOUNTER — Other Ambulatory Visit: Payer: Self-pay | Admitting: Orthopedic Surgery

## 2024-08-22 ENCOUNTER — Other Ambulatory Visit: Payer: Self-pay

## 2024-08-22 ENCOUNTER — Ambulatory Visit: Attending: Orthopedic Surgery | Admitting: Physical Therapy

## 2024-08-22 DIAGNOSIS — M25511 Pain in right shoulder: Secondary | ICD-10-CM | POA: Insufficient documentation

## 2024-08-22 DIAGNOSIS — M542 Cervicalgia: Secondary | ICD-10-CM | POA: Insufficient documentation

## 2024-08-22 DIAGNOSIS — M5412 Radiculopathy, cervical region: Secondary | ICD-10-CM | POA: Insufficient documentation

## 2024-08-22 DIAGNOSIS — M79621 Pain in right upper arm: Secondary | ICD-10-CM | POA: Diagnosis present

## 2024-08-22 MED ORDER — TRAMADOL HCL 50 MG PO TABS: 50.0000 mg | ORAL_TABLET | Freq: Four times a day (QID) | ORAL | 0 refills | Status: AC | PRN

## 2024-08-28 ENCOUNTER — Ambulatory Visit: Admitting: Physical Therapy

## 2024-09-05 ENCOUNTER — Ambulatory Visit: Attending: Orthopedic Surgery | Admitting: Physical Therapy
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
## 2024-09-07 ENCOUNTER — Telehealth: Payer: Self-pay

## 2024-09-07 ENCOUNTER — Ambulatory Visit

## 2024-09-07 NOTE — Telephone Encounter (Signed)
 TC due to missed visit.  VM left reminding patient of next scheduled appointment as well as need to cancel future visits per attendance policy.

## 2024-09-10 ENCOUNTER — Other Ambulatory Visit: Payer: Self-pay | Admitting: Orthopedic Surgery

## 2024-09-10 MED ORDER — TRAMADOL HCL 50 MG PO TABS: 50.0000 mg | ORAL_TABLET | Freq: Four times a day (QID) | ORAL | 0 refills | Status: AC | PRN

## 2024-09-11 ENCOUNTER — Ambulatory Visit: Admitting: Physical Therapy

## 2024-09-14 ENCOUNTER — Ambulatory Visit: Admitting: Physical Therapy

## 2024-09-17 ENCOUNTER — Ambulatory Visit

## 2024-09-21 ENCOUNTER — Ambulatory Visit

## 2025-08-12 ENCOUNTER — Ambulatory Visit: Admitting: Sports Medicine
